# Patient Record
Sex: Female | Born: 1955 | Race: White | Hispanic: No | Marital: Single | Smoking: Never smoker
Health system: Southern US, Community
[De-identification: ages and names within clinical notes are randomized; demographics above are authoritative.]

## PROBLEM LIST (undated history)

## (undated) DIAGNOSIS — J45909 Unspecified asthma, uncomplicated: Secondary | ICD-10-CM

## (undated) DIAGNOSIS — E079 Disorder of thyroid, unspecified: Secondary | ICD-10-CM

## (undated) HISTORY — PX: FOOT SURGERY: SHX648

## (undated) HISTORY — PX: MYOMECTOMY: SHX85

## (undated) HISTORY — PX: MANDIBLE FRACTURE SURGERY: SHX706

---

## 1999-05-16 ENCOUNTER — Other Ambulatory Visit: Admission: RE | Admit: 1999-05-16 | Discharge: 1999-05-16 | Payer: Self-pay | Admitting: Gynecology

## 1999-09-20 ENCOUNTER — Ambulatory Visit (HOSPITAL_COMMUNITY): Admission: RE | Admit: 1999-09-20 | Discharge: 1999-09-20 | Payer: Self-pay | Admitting: Gastroenterology

## 1999-09-20 ENCOUNTER — Encounter (INDEPENDENT_AMBULATORY_CARE_PROVIDER_SITE_OTHER): Payer: Self-pay

## 2000-10-22 ENCOUNTER — Other Ambulatory Visit: Admission: RE | Admit: 2000-10-22 | Discharge: 2000-10-22 | Payer: Self-pay | Admitting: Gynecology

## 2002-02-10 ENCOUNTER — Other Ambulatory Visit: Admission: RE | Admit: 2002-02-10 | Discharge: 2002-02-10 | Payer: Self-pay | Admitting: Gynecology

## 2004-06-29 ENCOUNTER — Encounter: Admission: RE | Admit: 2004-06-29 | Discharge: 2004-06-29 | Payer: Self-pay | Admitting: Family Medicine

## 2005-09-13 ENCOUNTER — Other Ambulatory Visit: Admission: RE | Admit: 2005-09-13 | Discharge: 2005-09-13 | Payer: Self-pay | Admitting: Gynecology

## 2006-04-20 ENCOUNTER — Encounter: Admission: RE | Admit: 2006-04-20 | Discharge: 2006-04-20 | Payer: Self-pay | Admitting: Family Medicine

## 2008-03-25 ENCOUNTER — Other Ambulatory Visit: Admission: RE | Admit: 2008-03-25 | Discharge: 2008-03-25 | Payer: Self-pay | Admitting: Gynecology

## 2008-06-25 ENCOUNTER — Encounter: Admission: RE | Admit: 2008-06-25 | Discharge: 2008-07-27 | Payer: Self-pay | Admitting: Family Medicine

## 2008-09-28 ENCOUNTER — Encounter: Admission: RE | Admit: 2008-09-28 | Discharge: 2008-09-28 | Payer: Self-pay | Admitting: Family Medicine

## 2010-08-03 ENCOUNTER — Encounter: Admission: RE | Admit: 2010-08-03 | Discharge: 2010-08-03 | Payer: Self-pay | Admitting: Gynecology

## 2011-02-02 ENCOUNTER — Other Ambulatory Visit: Payer: Self-pay | Admitting: Gynecology

## 2011-02-02 DIAGNOSIS — Z1231 Encounter for screening mammogram for malignant neoplasm of breast: Secondary | ICD-10-CM

## 2011-02-08 ENCOUNTER — Ambulatory Visit: Payer: Self-pay

## 2011-02-16 ENCOUNTER — Ambulatory Visit
Admission: RE | Admit: 2011-02-16 | Discharge: 2011-02-16 | Disposition: A | Discharge status: Home / Self Care | Payer: 59 | Source: Ambulatory Visit | Attending: Gynecology | Admitting: Gynecology

## 2011-02-16 DIAGNOSIS — Z1231 Encounter for screening mammogram for malignant neoplasm of breast: Secondary | ICD-10-CM

## 2011-12-19 ENCOUNTER — Other Ambulatory Visit: Payer: Self-pay | Admitting: Family Medicine

## 2011-12-19 DIAGNOSIS — R42 Dizziness and giddiness: Secondary | ICD-10-CM

## 2011-12-27 ENCOUNTER — Ambulatory Visit
Admission: RE | Admit: 2011-12-27 | Discharge: 2011-12-27 | Disposition: A | Discharge status: Home / Self Care | Payer: 59 | Source: Ambulatory Visit | Attending: Family Medicine | Admitting: Family Medicine

## 2011-12-27 DIAGNOSIS — R42 Dizziness and giddiness: Secondary | ICD-10-CM

## 2011-12-27 MED ORDER — GADOBENATE DIMEGLUMINE 529 MG/ML IV SOLN
15.0000 mL | Freq: Once | INTRAVENOUS | Status: AC | PRN
  Administered 2011-12-27: 15 mL via INTRAVENOUS

## 2012-01-08 ENCOUNTER — Ambulatory Visit (INDEPENDENT_AMBULATORY_CARE_PROVIDER_SITE_OTHER): Payer: 59 | Admitting: Sports Medicine

## 2012-01-08 VITALS — BP 110/70 | Ht 64.0 in | Wt 175.0 lb

## 2012-01-08 DIAGNOSIS — M7660 Achilles tendinitis, unspecified leg: Secondary | ICD-10-CM

## 2012-01-08 DIAGNOSIS — M2170 Unequal limb length (acquired), unspecified site: Secondary | ICD-10-CM | POA: Insufficient documentation

## 2012-01-08 DIAGNOSIS — M7750 Other enthesopathy of unspecified foot: Secondary | ICD-10-CM

## 2012-01-08 DIAGNOSIS — M7662 Achilles tendinitis, left leg: Secondary | ICD-10-CM

## 2012-01-08 MED ORDER — NITROGLYCERIN 0.2 MG/HR TD PT24: MEDICATED_PATCH | TRANSDERMAL | Status: DC

## 2012-01-08 NOTE — Progress Notes (Signed)
Patient ID: Caitlin Cox, female   DOB: 1956-06-13, 56 y.o.   MRN: [id number] Pt is a 56 year old female who presents today for left foot pain.  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  Pt reports pain in the left heal and achilles tendon that is made worse by playing or standing on the balls of her feet.  This has been a long-standing issue.  She had a custom set of orthotics made in August of 2012 for use when walking and has been using OTC sports orthotics when playing.  These have helped slightly.  Patient is currently trying ice and celebrex for her pain.  These help somewhat.  Pain does not radiate and is not bad enough for her to stop playing.  Objective: General: Slightly overweight female, cooperative with exam Ext:No pain with pelvic rock.  Full ROM of both hips and knees without any pain/discomfort.   The patient's left foot transverse arch is moderately collapsed.  She has a thick callous in the region of the 2nd and 3rd metatarsal heads. There is pain on palpation of the distal Achilles tendon and posterior calcaneous on the left..  No pain on palpation of the plantar fascia.  There is mild but not significant pain on the right at insertion of PF Leg length measurements show right leg is 1.2cm shorter than left.  Ultrasound of the patient's feet show significant retro calcaneal bursitis on the left, thickened distal achilles tendon bilaterally, right worse than left;  The right measures 0.83 and looks hypoechoic on trans scan.  The left is 0.56 and the tendon looks normal. There is mild calcification and thickening of plantar fascia bilaterally with a spur on the left.  Left measures 0.53 vs 0.46 on RT.

## 2012-01-08 NOTE — Assessment & Plan Note (Signed)
We will follow this and consider CSI only if not responding to AT [facility] plan

## 2012-01-08 NOTE — Assessment & Plan Note (Addendum)
Achilles tendon rehab protoco; use 1/4 patch protocoll with nitro patches.  Bilateral heal lifts, R>L.  Reck in 4 to 6 wks  she needs new orthotics for use in tennis

## 2012-01-08 NOTE — Patient Instructions (Signed)
Nitroglycerin Protocol   Apply 1/4 nitroglycerin patch to affected area daily.  Change position of patch within the affected area every 24 hours.  You may experience a headache during the first 1-2 weeks of using the patch, these should subside.  If you experience headaches after beginning nitroglycerin patch treatment, you may take your preferred over the counter pain reliever.  Another side effect of the nitroglycerin patch is skin irritation or rash related to patch adhesive.  Please notify our office if you develop more severe headaches or rash, and stop the patch.  Tendon healing with nitroglycerin patch may require 12 to 24 weeks depending on the extent of injury.  Men should not use if taking Viagra, Cialis, or Levitra.   Do not use if you have migraines or rosacea.   Please do suggested exercises daily  Ice achilles tendon after playing tennis   Please follow up in 1 month   Thank you for seeing [place] today!

## 2012-01-08 NOTE — Assessment & Plan Note (Signed)
Some correction made with felt lifts This needs to be corrected in custom orthotics as most of her breakdown is on left side which is longer

## 2012-01-24 ENCOUNTER — Other Ambulatory Visit: Payer: Self-pay | Admitting: Family Medicine

## 2012-01-24 ENCOUNTER — Other Ambulatory Visit: Payer: Self-pay | Admitting: Gynecology

## 2012-01-24 DIAGNOSIS — Z1231 Encounter for screening mammogram for malignant neoplasm of breast: Secondary | ICD-10-CM

## 2012-02-07 ENCOUNTER — Encounter: Payer: Self-pay | Admitting: Sports Medicine

## 2012-02-07 ENCOUNTER — Ambulatory Visit (INDEPENDENT_AMBULATORY_CARE_PROVIDER_SITE_OTHER): Payer: 59 | Admitting: Sports Medicine

## 2012-02-07 VITALS — BP 137/98 | HR 73

## 2012-02-07 DIAGNOSIS — M7750 Other enthesopathy of unspecified foot: Secondary | ICD-10-CM

## 2012-02-07 DIAGNOSIS — M7662 Achilles tendinitis, left leg: Secondary | ICD-10-CM

## 2012-02-07 DIAGNOSIS — M7660 Achilles tendinitis, unspecified leg: Secondary | ICD-10-CM

## 2012-02-07 MED ORDER — MELOXICAM 15 MG PO TABS: ORAL_TABLET | ORAL | Status: DC

## 2012-02-07 MED ORDER — NITROGLYCERIN 0.2 MG/HR TD PT24: MEDICATED_PATCH | TRANSDERMAL | Status: DC

## 2012-02-07 NOTE — Assessment & Plan Note (Addendum)
Improved. Cont NTG, incr to half patch. Cont HEP. Cont Heel lifts. Return to clinic in 4 weeks.

## 2012-02-07 NOTE — Assessment & Plan Note (Signed)
Improved

## 2012-02-07 NOTE — Progress Notes (Signed)
  Subjective:    Patient ID: Caitlin Cox, female    DOB: Oct 02, 1956, 56 y.o.   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 At the last visit she was also noted to have some retrocalcaneal bursitis, as well as a leg length discrepancy. She was started on the nitroglycerin protocol, as well as eccentric exercises.  Overall she notes she is approximately 10-20% improved.  Review of Systems    No fevers, chills, night sweats, weight loss, chest pain, or shortness of breath.  Social History: Non-smoker. Objective:   Physical Exam General:  Well developed, well nourished, and in no acute distress. Neuro:  Alert and oriented x3, extra-ocular muscles intact. Skin: Warm and dry, no rashes noted. Respiratory:  Not using accessory muscles, speaking in full sentences. Musculoskeletal: Left spaceAnkle: No visible erythema or swelling. Range of motion is full in all directions. Strength is 5/5 in all directions. Stable lateral and medial ligaments; squeeze test and kleiger test unremarkable; Talar dome nontender; No pain at base of 5th MT; No tenderness over cuboid; No tenderness over N spot or navicular prominence No tenderness on posterior aspects of lateral and medial malleolus No sign of peroneal tendon subluxations or tenderness to palpation Negative tarsal tunnel tinel's No longer has any pain either over the calcaneal bursa, or over the retrocalcaneal bursa.  Tenderness still present over the mid Achilles tendon. She also has a nodule.  Ultrasound of the left Achilles shows a maximum width of 0.74 cm. She also has a significant amount of neovascularization.     Assessment & Plan:

## 2012-02-19 ENCOUNTER — Ambulatory Visit: Payer: 59

## 2012-03-13 ENCOUNTER — Ambulatory Visit
Admission: RE | Admit: 2012-03-13 | Discharge: 2012-03-13 | Disposition: A | Discharge status: Home / Self Care | Payer: 59 | Source: Ambulatory Visit | Attending: Family Medicine | Admitting: Family Medicine

## 2012-03-13 DIAGNOSIS — Z1231 Encounter for screening mammogram for malignant neoplasm of breast: Secondary | ICD-10-CM

## 2012-03-14 ENCOUNTER — Other Ambulatory Visit: Payer: Self-pay | Admitting: Family Medicine

## 2012-03-14 DIAGNOSIS — N644 Mastodynia: Secondary | ICD-10-CM

## 2012-03-20 ENCOUNTER — Ambulatory Visit (INDEPENDENT_AMBULATORY_CARE_PROVIDER_SITE_OTHER): Payer: 59 | Admitting: Sports Medicine

## 2012-03-20 VITALS — BP 142/86

## 2012-03-20 DIAGNOSIS — G5600 Carpal tunnel syndrome, unspecified upper limb: Secondary | ICD-10-CM

## 2012-03-20 DIAGNOSIS — G5601 Carpal tunnel syndrome, right upper limb: Secondary | ICD-10-CM | POA: Insufficient documentation

## 2012-03-20 NOTE — Assessment & Plan Note (Addendum)
Wrist brace qHS. Meloxicam. RTC 3-4 weeks hydrodissection/injection if no better at return visit.

## 2012-03-20 NOTE — Progress Notes (Signed)
Patient ID: Caitlin Cox, female   DOB: [birth year], 56 y.o.   MRN: [id number] Subjective:   [id number] ankle pain, as well as new wrist pain.  HPI: Right wrist pain: Present for many months, localized at the proximal carpal full, with numbness and tingling into the first or fingers. Worse at night, tends to drop objects. Has not had any conservative therapy, does have nighttime splints but does not wear them.  Left Achilles tendinosis: Currently at the 8 week mark for nitroglycerin, using one half of a patch. Overall 50-60% improved from day one, however somewhat exacerbated recently at the beach. She continues to do her home exercises, and continues to wear her heel lift most of the time.   Past medical history, surgical history, family history, social history, allergies, and medications reviewed from the medical record and no changes needed.  Review of Systems: No fevers, chills, night sweats, weight loss, chest pain, or shortness of breath.    Objective:  General:  Well Developed, well nourished, and in no acute distress. Neuro:  Alert and oriented x3, extra-ocular muscles intact. Skin: Warm and dry, no rashes noted. Respiratory:  Not using accessory muscles, speaking in full sentences. Musculoskeletal: Left Ankle: No visible erythema or swelling. Range of motion is full in all directions. Strength is 5/5 in all directions. Stable lateral and medial ligaments; squeeze test and kleiger test unremarkable; Talar dome nontender; No pain at base of 5th MT; No tenderness over cuboid; No tenderness over N spot or navicular prominence No tenderness on posterior aspects of lateral and medial malleolus No sign of peroneal tendon subluxations or tenderness to palpation Negative tarsal tunnel tinel's Tender to palpation over the Achilles, with a small palpable nodule.  Right Wrist: Inspection normal with no visible erythema or swelling. ROM smooth and normal with good flexion and  extension and ulnar/radial deviation that is symmetrical with opposite wrist. Palpation is normal over metacarpals, navicular, lunate, and TFCC; tendons without tenderness/ swelling No snuffbox tenderness. No tenderness over Canal of [place]. Strength 5/5 in all directions without pain. Negative Finkelstein Positive Tinel's and phalens. Negative Watson's test. No thenar atrophy.  Assessment & Plan:

## 2012-03-20 NOTE — Assessment & Plan Note (Signed)
>  50% improved. Cont NTG. Cont HEP. Cont Heel lift. RTC 4 weeks.

## 2012-03-26 ENCOUNTER — Ambulatory Visit: Payer: 59 | Admitting: Sports Medicine

## 2012-03-29 ENCOUNTER — Ambulatory Visit
Admission: RE | Admit: 2012-03-29 | Discharge: 2012-03-29 | Disposition: A | Discharge status: Home / Self Care | Payer: 59 | Source: Ambulatory Visit | Attending: Family Medicine | Admitting: Family Medicine

## 2012-03-29 DIAGNOSIS — N644 Mastodynia: Secondary | ICD-10-CM

## 2012-04-18 ENCOUNTER — Ambulatory Visit (INDEPENDENT_AMBULATORY_CARE_PROVIDER_SITE_OTHER): Payer: 59 | Admitting: Sports Medicine

## 2012-04-18 VITALS — BP 146/93

## 2012-04-18 DIAGNOSIS — G5600 Carpal tunnel syndrome, unspecified upper limb: Secondary | ICD-10-CM

## 2012-04-18 DIAGNOSIS — G5601 Carpal tunnel syndrome, right upper limb: Secondary | ICD-10-CM

## 2012-04-18 DIAGNOSIS — M7660 Achilles tendinitis, unspecified leg: Secondary | ICD-10-CM

## 2012-04-18 DIAGNOSIS — M7662 Achilles tendinitis, left leg: Secondary | ICD-10-CM

## 2012-04-18 MED ORDER — NITROGLYCERIN 0.2 MG/HR TD PT24: MEDICATED_PATCH | TRANSDERMAL | Status: DC

## 2012-04-18 NOTE — Assessment & Plan Note (Signed)
Improved from 0.22cm2 to 0.18cm2  Night splints help - keep these up Prn mobic  Exercises

## 2012-04-18 NOTE — Progress Notes (Signed)
  Subjective:    Patient ID: Caitlin Cox, female    DOB: 06/08/1956, 56 y.o.   MRN: [id number]  HPI Left achilles tendon, able to do strenuous hike yesterday without much pain  Has been able to do plyometrics without pain Has not been doing achilles rehab exercises on 1 leg but does do them on 2  Has been doing hand stretches and exercises, no longer keeping her awake at night Meloxicam use at night when in pain - not using that much Uses night splint and that works well Still feels some tingling with tennis but has played a few times   Review of Systems     Objective:   Physical Exam NAD Right hand: Augmented phalens negative Good grip strength, no pain Normal finger flexion strength, normal thumb strength  Left achilles: Nodule gone now, no swelling, mild tenderness to squeeze  Tendon feels normal caliber at 4 cm above heel and thicker than normal at insertion  MSK [place] Median nerve prox crease diameter 0.18cm2 and distal is 0.14cm2  AT Marked neovessel activity At insertion partial tear with hypoechoic change Less retrocalcaneal bursal swelling but still significant Width is 1.04cm       Assessment & Plan:  Need to do achilles rehab exercises 3x/week

## 2012-04-18 NOTE — Assessment & Plan Note (Signed)
This is healing clinically On scan still very abnormal with partial tear  We will probably need to keep up NTG minimum of 6 mos - maybe 1 year Keep up exercises   Reck 2 to 3 mos

## 2012-06-07 ENCOUNTER — Ambulatory Visit (INDEPENDENT_AMBULATORY_CARE_PROVIDER_SITE_OTHER): Payer: 59 | Admitting: Family Medicine

## 2012-06-07 ENCOUNTER — Ambulatory Visit (HOSPITAL_BASED_OUTPATIENT_CLINIC_OR_DEPARTMENT_OTHER)
Admission: RE | Admit: 2012-06-07 | Discharge: 2012-06-07 | Disposition: A | Discharge status: Home / Self Care | Payer: 59 | Source: Ambulatory Visit | Attending: Family Medicine | Admitting: Family Medicine

## 2012-06-07 ENCOUNTER — Encounter: Payer: Self-pay | Admitting: Family Medicine

## 2012-06-07 VITALS — BP 153/93 | HR 76 | Ht 64.0 in | Wt 172.0 lb

## 2012-06-07 DIAGNOSIS — X58XXXA Exposure to other specified factors, initial encounter: Secondary | ICD-10-CM | POA: Insufficient documentation

## 2012-06-07 DIAGNOSIS — S6980XA Other specified injuries of unspecified wrist, hand and finger(s), initial encounter: Secondary | ICD-10-CM | POA: Insufficient documentation

## 2012-06-07 DIAGNOSIS — S6990XA Unspecified injury of unspecified wrist, hand and finger(s), initial encounter: Secondary | ICD-10-CM

## 2012-06-07 NOTE — Assessment & Plan Note (Signed)
Right 4th digit injury - radiographs negative for fracture.  Tendons intact with full strength on testing at PIP and DIP joints.  Collateral ligaments intact though pain on stressing UCL 4th PIP suggesting sprain of this ligament and PIP joint.  Reassured patient.  Shown how to buddy tape finger.  Should improve over the next 2-3 weeks. Tylenol, nsaids as needed. Icing as needed.  Call if any concerns or if worsening instead of improving over next 2-3 weeks.

## 2012-06-07 NOTE — Progress Notes (Signed)
Subjective:    Patient ID: Caitlin Cox, female    DOB: 10-27-1955, 56 y.o.   MRN: [id number]  PCP: Gweneth Dimitri MD  HPI 56 yo F here for right 4th finger injury.  Patient reports on 8/27 while holding leash of her dog, the dog saw a squirrel and started to run. 4th and 5th fingers caught and tangled up in leash. Jerked both of these fingers causing pain, swelling in both throughout. 5th finger has improved completely but still having pain in 4th digit around PIP. Did not need to put this finger back into place. Has been icing and splinting. Unable to sleep due to pain at times. Has not had this worked up yet. Right handed.  History reviewed. No pertinent past medical history.  Current Outpatient Prescriptions on File Prior to Visit  Medication Sig Dispense Refill  . buPROPion (WELLBUTRIN) 75 MG tablet Take 75 mg by mouth daily.      . COMBIPATCH 0.05-0.14 MG/DAY Place 1 patch onto the skin 2 (two) times a week.      . Fluticasone-Salmeterol (ADVAIR DISKUS) 250-50 MCG/DOSE AEPB Inhale 1 puff into the lungs every 12 (twelve) hours.      . nitroGLYCERIN (NITRODUR - DOSED IN MG/24 HR) 0.2 mg/hr Apply 1/2 patch to affected area daily.  Change patch every 24 hours.  30 patch  1  . sertraline (ZOLOFT) 50 MG tablet Take 50 mg by mouth daily.      Marland Kitchen SYNTHROID 150 MCG tablet Take 150 mcg by mouth daily.      . clonazePAM (KLONOPIN) 0.5 MG tablet Take 0.5 mg by mouth as needed.        Past Surgical History  Procedure Date  . Mandible fracture surgery   . Foot surgery     Allergies  Allergen Reactions  . Chlorzoxazone   . Doxycycline   . Oxycodone-Acetaminophen   . Penicillins     History   Social History  . Marital Status: Single    Spouse Name: N/A    Number of Children: N/A  . Years of Education: N/A   Occupational History  . Not on file.   Social History Main Topics  . Smoking status: Never Smoker   . Smokeless tobacco: Never Used  . Alcohol Use: Not on file   . Drug Use: Not on file  . Sexually Active: Not on file   Other Topics Concern  . Not on file   Social History Narrative  . No narrative on file    Family History  Problem Relation Age of Onset  . Heart attack Mother   . Diabetes Mother   . Heart attack Father   . Hyperlipidemia Father   . Hypertension Neg Hx   . Sudden death Neg Hx     BP 153/93  Pulse 76  Ht 5\' 4"  (1.626 m)  Wt 172 lb (78.019 kg)  BMI 29.52 kg/m2  Review of Systems See HPI above.    Objective:   Physical Exam Gen: NAD  R hand: Mild swelling circumferentially about PIP of 4th digit.  No bruising, other deformity. TTP greatest over UCL PIP joint 4th digit though less pain circumferentially about this joint.   FROM PIP, DIP, MCP of 4th digit. 5/5 strength with resisted flexion and extension of PIP and DIP joints 4th digit. Collateral ligaments intact but pain with UCL stress of 4th PIP joint. NVI distally.     Assessment & Plan:  1. Right 4th digit injury -  radiographs negative for fracture.  Tendons intact with full strength on testing at PIP and DIP joints.  Collateral ligaments intact though pain on stressing UCL 4th PIP suggesting sprain of this ligament and PIP joint.  Reassured patient.  Shown how to buddy tape finger.  Should improve over the next 2-3 weeks. Tylenol, nsaids as needed. Icing as needed.  Call if any concerns or if worsening instead of improving over next 2-3 weeks.

## 2012-07-16 ENCOUNTER — Encounter: Payer: Self-pay | Admitting: Sports Medicine

## 2012-07-16 ENCOUNTER — Ambulatory Visit (INDEPENDENT_AMBULATORY_CARE_PROVIDER_SITE_OTHER): Payer: 59 | Admitting: Sports Medicine

## 2012-07-16 VITALS — BP 162/97 | HR 69 | Ht 64.0 in | Wt 173.0 lb

## 2012-07-16 DIAGNOSIS — M722 Plantar fascial fibromatosis: Secondary | ICD-10-CM

## 2012-07-16 DIAGNOSIS — M6788 Other specified disorders of synovium and tendon, other site: Secondary | ICD-10-CM

## 2012-07-16 DIAGNOSIS — M7660 Achilles tendinitis, unspecified leg: Secondary | ICD-10-CM

## 2012-07-16 NOTE — Progress Notes (Signed)
  Subjective:    Patient ID: Caitlin Cox, female    DOB: Jun 27, 1956, 56 y.o.   MRN: [id number]  HPI Caitlin Cox is coming in today for recurrent left heel pain.  She is currently being treated by Dr. Darrick Penna for left achilles tendonopathy with exercises and nitroglycerin patch.  She reports doing well until about 10 days ago.  She was helping her brother build a deck which involved lots of squatting which flared up her pain.  She states this improved with 3 days of rest, icing, and home ultrasound machine.  Then, 4 days ago, again while helping with the deck, the pain flared up.  This pain is located more around the heel and at the posterior aspect of the arch which is different from her achilles pain.  She has been using a heel cup instead of her orthotics as well as ice and ultrasound again, but it continues to be sore and limit her activities.   Review of Systems     Objective:   Physical Exam Gen: alert, cooperative, NAD Left foot: no erythema or edema, full AROM, tender to palpation over plantar fascia insertion and mildly over achilles tendon, 5/5 strength in dorsiflexion and plantar flexion without pain  Left foot ultrasound: Plantar fascia is normal thickness with area of underlying hypoechogenicity.  Achilles tendon still with several areas of hypoechogenicity within the tendon, but appears improved from previous.      Assessment & Plan:  Left heel pain: Likely combination of achilles tendonopathy and early plantar fasciitis.  Continue to use heel cup for the next week or until foot pain improves.  Ice heel daily for the next week.  Will give arch strap to wear to provide support.  Follow up in 3 weeks or sooner if needed.  HTN: BP quite elevated today, but she reports typically much better.  Did go to the chiropractor for adjustment prior to this appointment.

## 2012-07-30 ENCOUNTER — Ambulatory Visit: Payer: 59 | Admitting: Sports Medicine

## 2013-01-02 ENCOUNTER — Other Ambulatory Visit: Payer: Self-pay | Admitting: Gynecology

## 2013-01-02 DIAGNOSIS — N63 Unspecified lump in unspecified breast: Secondary | ICD-10-CM

## 2013-01-14 ENCOUNTER — Other Ambulatory Visit: Payer: 59

## 2013-01-20 ENCOUNTER — Ambulatory Visit
Admission: RE | Admit: 2013-01-20 | Discharge: 2013-01-20 | Disposition: A | Discharge status: Home / Self Care | Payer: 59 | Source: Ambulatory Visit | Attending: Gynecology | Admitting: Gynecology

## 2013-01-20 ENCOUNTER — Other Ambulatory Visit: Payer: Self-pay | Admitting: Gynecology

## 2013-01-20 DIAGNOSIS — N63 Unspecified lump in unspecified breast: Secondary | ICD-10-CM

## 2013-02-07 ENCOUNTER — Ambulatory Visit (INDEPENDENT_AMBULATORY_CARE_PROVIDER_SITE_OTHER): Payer: 59 | Admitting: Family Medicine

## 2013-02-07 ENCOUNTER — Encounter: Payer: Self-pay | Admitting: Family Medicine

## 2013-02-07 ENCOUNTER — Ambulatory Visit (HOSPITAL_BASED_OUTPATIENT_CLINIC_OR_DEPARTMENT_OTHER)
Admission: RE | Admit: 2013-02-07 | Discharge: 2013-02-07 | Disposition: A | Discharge status: Home / Self Care | Payer: 59 | Source: Ambulatory Visit | Attending: Family Medicine | Admitting: Family Medicine

## 2013-02-07 VITALS — BP 149/78 | HR 78 | Ht 64.0 in | Wt 170.0 lb

## 2013-02-07 DIAGNOSIS — M79609 Pain in unspecified limb: Secondary | ICD-10-CM

## 2013-02-07 DIAGNOSIS — M7660 Achilles tendinitis, unspecified leg: Secondary | ICD-10-CM

## 2013-02-07 DIAGNOSIS — M7662 Achilles tendinitis, left leg: Secondary | ICD-10-CM

## 2013-02-07 DIAGNOSIS — M79671 Pain in right foot: Secondary | ICD-10-CM

## 2013-02-07 MED ORDER — NITROGLYCERIN 0.2 MG/HR TD PT24: MEDICATED_PATCH | TRANSDERMAL | Status: DC

## 2013-02-07 NOTE — Patient Instructions (Addendum)
Your x-rays were negative for a fracture. You have sprained and bruised your foot/5th metatarsal. Consider hard soled shoe for comfort. Icing 15 minutes at a time 3-4 times a day. Tylenol and/or aleve as needed for pain. Activities as tolerated. Follow up with me as needed.

## 2013-02-10 ENCOUNTER — Encounter: Payer: Self-pay | Admitting: Family Medicine

## 2013-02-10 DIAGNOSIS — M79671 Pain in right foot: Secondary | ICD-10-CM | POA: Insufficient documentation

## 2013-02-10 NOTE — Assessment & Plan Note (Signed)
Right foot sprain - radiographs negative.  Icing, tylenol/aleve as needed.  Activities as tolerated.  Declined hard soled shoe for now.  F/u prn.  Should improve over next 2-3 weeks.

## 2013-02-10 NOTE — Progress Notes (Signed)
  Subjective:    Patient ID: Caitlin Cox, female    DOB: [birth year], 57 y.o.   MRN: [id number]  PCP: Gweneth Dimitri MD  HPI 57 yo F here for right foot injury.  Patient reports she was going downstairs on 4/30 when she missed the last step and inverted her right ankle then fell down. Thought she broke her pinky toe as most of pain, swelling was over here. Has been icing. Not taking any meds or using a hard sole shoe. No ankle pain. No other complaints.  History reviewed. No pertinent past medical history.  Current Outpatient Prescriptions on File Prior to Visit  Medication Sig Dispense Refill  . buPROPion (WELLBUTRIN) 75 MG tablet Take 75 mg by mouth daily.      . clonazePAM (KLONOPIN) 0.5 MG tablet Take 0.5 mg by mouth as needed.      Marland Kitchen EVAMIST 1.53 MG/SPRAY transdermal spray Apply topically daily.      . progesterone (PROMETRIUM) 200 MG capsule Take 200 mg by mouth daily.      . sertraline (ZOLOFT) 50 MG tablet Take 50 mg by mouth daily.      . SYMBICORT 160-4.5 MCG/ACT inhaler Inhale 2 puffs into the lungs daily.      Marland Kitchen SYNTHROID 150 MCG tablet Take 150 mcg by mouth daily.      . valACYclovir (VALTREX) 1000 MG tablet Take 1 g by mouth Once daily as needed.       No current facility-administered medications on file prior to visit.    Past Surgical History  Procedure Laterality Date  . Mandible fracture surgery    . Foot surgery      Allergies  Allergen Reactions  . Chlorzoxazone   . Doxycycline   . Oxycodone-Acetaminophen   . Penicillins     History   Social History  . Marital Status: Single    Spouse Name: N/A    Number of Children: N/A  . Years of Education: N/A   Occupational History  . Not on file.   Social History Main Topics  . Smoking status: Never Smoker   . Smokeless tobacco: Never Used  . Alcohol Use: Not on file  . Drug Use: Not on file  . Sexually Active: Not on file   Other Topics Concern  . Not on file   Social History Narrative   . No narrative on file    Family History  Problem Relation Age of Onset  . Heart attack Mother   . Diabetes Mother   . Heart attack Father   . Hyperlipidemia Father   . Hypertension Neg Hx   . Sudden death Neg Hx     BP 149/78  Pulse 78  Ht 5\' 4"  (1.626 m)  Wt 170 lb (77.111 kg)  BMI 29.17 kg/m2  Review of Systems See HPI above.    Objective:   Physical Exam Gen: NAD  R foot/ankle: Mild swelling, bruising 5th digit.  No other deformity. FROM of ankle with 5/5 strength all directions, no pain. TTP 5th digit, distal 5th metatarsal.  No other TTP foot/ankle. Negative ant drawer and talar tilt.   Negative syndesmotic compression. Mild pain metatarsal squeeze. Thompsons test negative. NV intact distally.    Assessment & Plan:  1. Right foot sprain - radiographs negative.  Icing, tylenol/aleve as needed.  Activities as tolerated.  Declined hard soled shoe for now.  F/u prn.  Should improve over next 2-3 weeks.

## 2013-06-24 ENCOUNTER — Encounter: Payer: Self-pay | Admitting: Family Medicine

## 2013-06-24 ENCOUNTER — Ambulatory Visit (INDEPENDENT_AMBULATORY_CARE_PROVIDER_SITE_OTHER): Payer: 59 | Admitting: Family Medicine

## 2013-06-24 VITALS — BP 146/92 | HR 82 | Ht 64.0 in | Wt 170.0 lb

## 2013-06-24 DIAGNOSIS — M25569 Pain in unspecified knee: Secondary | ICD-10-CM

## 2013-06-24 DIAGNOSIS — M25561 Pain in right knee: Secondary | ICD-10-CM

## 2013-06-24 MED ORDER — MELOXICAM 15 MG PO TABS: 15.0000 mg | ORAL_TABLET | Freq: Every day | ORAL | Status: DC

## 2013-06-25 ENCOUNTER — Encounter: Payer: Self-pay | Admitting: Family Medicine

## 2013-06-25 DIAGNOSIS — M25561 Pain in right knee: Secondary | ICD-10-CM | POA: Insufficient documentation

## 2013-06-25 NOTE — Progress Notes (Signed)
Patient ID: Caitlin Cox, female   DOB: [birth year], 57 y.o.   MRN: [id number]  PCP: Gweneth Dimitri, MD  Subjective:   HPI: Patient is a 57 y.o. female here for right knee pain.  Patient denies known injury. States pain started over past 2 weeks - started following mountain biking for 2.5 hours . Soreness felt in lateral knee. Taking meloxicam which helps though running out of this. Icing as well. Has felt warm to the touch. Played tennis on Monday and pain worsened. No catching, locking, giving out.  History reviewed. No pertinent past medical history.  Current Outpatient Prescriptions on File Prior to Visit  Medication Sig Dispense Refill  . buPROPion (WELLBUTRIN) 75 MG tablet Take 75 mg by mouth daily.      . clonazePAM (KLONOPIN) 0.5 MG tablet Take 0.5 mg by mouth as needed.      Marland Kitchen EVAMIST 1.53 MG/SPRAY transdermal spray Apply topically daily.      . nitroGLYCERIN (NITRODUR - DOSED IN MG/24 HR) 0.2 mg/hr Apply 1/2 patch to affected area daily.  Change patch every 24 hours.  30 patch  1  . progesterone (PROMETRIUM) 200 MG capsule Take 200 mg by mouth daily.      . sertraline (ZOLOFT) 50 MG tablet Take 50 mg by mouth daily.      . SYMBICORT 160-4.5 MCG/ACT inhaler Inhale 2 puffs into the lungs daily.      Marland Kitchen SYNTHROID 150 MCG tablet Take 150 mcg by mouth daily.      . valACYclovir (VALTREX) 1000 MG tablet Take 1 g by mouth Once daily as needed.       No current facility-administered medications on file prior to visit.    Past Surgical History  Procedure Laterality Date  . Mandible fracture surgery    . Foot surgery      Allergies  Allergen Reactions  . Chlorzoxazone   . Doxycycline   . Oxycodone-Acetaminophen   . Penicillins     History   Social History  . Marital Status: Single    Spouse Name: N/A    Number of Children: N/A  . Years of Education: N/A   Occupational History  . Not on file.   Social History Main Topics  . Smoking status: Never Smoker   .  Smokeless tobacco: Never Used  . Alcohol Use: Not on file  . Drug Use: Not on file  . Sexual Activity: Not on file   Other Topics Concern  . Not on file   Social History Narrative  . No narrative on file    Family History  Problem Relation Age of Onset  . Heart attack Mother   . Diabetes Mother   . Heart attack Father   . Hyperlipidemia Father   . Hypertension Neg Hx   . Sudden death Neg Hx     BP 146/92  Pulse 82  Ht 5\' 4"  (1.626 m)  Wt 170 lb (77.111 kg)  BMI 29.17 kg/m2  Review of Systems: See HPI above.    Objective:  Physical Exam:  Gen: NAD  Right knee: No gross deformity, ecchymoses, swelling. No TTP. FROM. Negative ant/post drawers. Negative valgus/varus testing. Negative lachmanns. Negative mcmurrays, apleys, patellar apprehension. NV intact distally.    Assessment & Plan:  1. Right knee pain - exam benign and no acute injury.  Suspect this is due to a flare of underlying arthritis.  Reassured patient.  Discussed tylenol and refilled her meloxicam.  Consider capsaicin, glucosamine, injection, radiographs if this  does not continue to improve.  Otherwise f/u prn.

## 2013-06-25 NOTE — Assessment & Plan Note (Signed)
exam benign and no acute injury.  Suspect this is due to a flare of underlying arthritis.  Reassured patient.  Discussed tylenol and refilled her meloxicam.  Consider capsaicin, glucosamine, injection, radiographs if this does not continue to improve.  Otherwise f/u prn.

## 2013-08-31 ENCOUNTER — Emergency Department (HOSPITAL_COMMUNITY): Payer: 59

## 2013-08-31 ENCOUNTER — Encounter (HOSPITAL_COMMUNITY): Payer: Self-pay | Admitting: Emergency Medicine

## 2013-08-31 ENCOUNTER — Inpatient Hospital Stay (HOSPITAL_COMMUNITY)
Admission: EM | Admit: 2013-08-31 | Discharge: 2013-09-05 | DRG: 964 | Disposition: A | Discharge status: Home / Self Care | Payer: 59 | Attending: General Surgery | Admitting: General Surgery

## 2013-08-31 DIAGNOSIS — S2232XA Fracture of one rib, left side, initial encounter for closed fracture: Secondary | ICD-10-CM

## 2013-08-31 DIAGNOSIS — S32009A Unspecified fracture of unspecified lumbar vertebra, initial encounter for closed fracture: Secondary | ICD-10-CM

## 2013-08-31 DIAGNOSIS — Y998 Other external cause status: Secondary | ICD-10-CM

## 2013-08-31 DIAGNOSIS — M25529 Pain in unspecified elbow: Secondary | ICD-10-CM | POA: Diagnosis present

## 2013-08-31 DIAGNOSIS — S2249XA Multiple fractures of ribs, unspecified side, initial encounter for closed fracture: Principal | ICD-10-CM | POA: Diagnosis present

## 2013-08-31 DIAGNOSIS — W132XXA Fall from, out of or through roof, initial encounter: Secondary | ICD-10-CM

## 2013-08-31 DIAGNOSIS — H539 Unspecified visual disturbance: Secondary | ICD-10-CM | POA: Diagnosis present

## 2013-08-31 DIAGNOSIS — S270XXA Traumatic pneumothorax, initial encounter: Secondary | ICD-10-CM | POA: Diagnosis present

## 2013-08-31 DIAGNOSIS — J939 Pneumothorax, unspecified: Secondary | ICD-10-CM | POA: Diagnosis present

## 2013-08-31 DIAGNOSIS — IMO0002 Reserved for concepts with insufficient information to code with codable children: Secondary | ICD-10-CM

## 2013-08-31 DIAGNOSIS — S2242XA Multiple fractures of ribs, left side, initial encounter for closed fracture: Secondary | ICD-10-CM

## 2013-08-31 DIAGNOSIS — Y92009 Unspecified place in unspecified non-institutional (private) residence as the place of occurrence of the external cause: Secondary | ICD-10-CM

## 2013-08-31 DIAGNOSIS — S37819A Unspecified injury of adrenal gland, initial encounter: Secondary | ICD-10-CM | POA: Diagnosis present

## 2013-08-31 DIAGNOSIS — K5900 Constipation, unspecified: Secondary | ICD-10-CM | POA: Diagnosis not present

## 2013-08-31 DIAGNOSIS — R42 Dizziness and giddiness: Secondary | ICD-10-CM | POA: Diagnosis not present

## 2013-08-31 DIAGNOSIS — Y93H9 Activity, other involving exterior property and land maintenance, building and construction: Secondary | ICD-10-CM

## 2013-08-31 DIAGNOSIS — J45909 Unspecified asthma, uncomplicated: Secondary | ICD-10-CM | POA: Diagnosis present

## 2013-08-31 DIAGNOSIS — E039 Hypothyroidism, unspecified: Secondary | ICD-10-CM | POA: Diagnosis present

## 2013-08-31 DIAGNOSIS — E2749 Other adrenocortical insufficiency: Secondary | ICD-10-CM | POA: Diagnosis present

## 2013-08-31 DIAGNOSIS — W11XXXA Fall on and from ladder, initial encounter: Secondary | ICD-10-CM | POA: Diagnosis present

## 2013-08-31 HISTORY — DX: Disorder of thyroid, unspecified: E07.9

## 2013-08-31 HISTORY — DX: Unspecified asthma, uncomplicated: J45.909

## 2013-08-31 LAB — CBC WITH DIFFERENTIAL/PLATELET
Basophils Absolute: 0.1 10*3/uL (ref 0.0–0.1)
Basophils Relative: 0 % (ref 0–1)
HCT: 43.2 % (ref 36.0–46.0)
Hemoglobin: 15.3 g/dL — ABNORMAL HIGH (ref 12.0–15.0)
Lymphocytes Relative: 16 % (ref 12–46)
Monocytes Absolute: 0.8 10*3/uL (ref 0.1–1.0)
Neutro Abs: 13.8 10*3/uL — ABNORMAL HIGH (ref 1.7–7.7)
Neutrophils Relative %: 78 % — ABNORMAL HIGH (ref 43–77)
RDW: 12.7 % (ref 11.5–15.5)
WBC: 17.7 10*3/uL — ABNORMAL HIGH (ref 4.0–10.5)

## 2013-08-31 LAB — POCT I-STAT, CHEM 8
Chloride: 101 mEq/L (ref 96–112)
Glucose, Bld: 201 mg/dL — ABNORMAL HIGH (ref 70–99)
HCT: 48 % — ABNORMAL HIGH (ref 36.0–46.0)
Hemoglobin: 16.3 g/dL — ABNORMAL HIGH (ref 12.0–15.0)
Potassium: 4.7 mEq/L (ref 3.5–5.1)
Sodium: 136 mEq/L (ref 135–145)

## 2013-08-31 MED ORDER — BUPROPION HCL 75 MG PO TABS
150.0000 mg | ORAL_TABLET | Freq: Every day | ORAL | Status: DC
  Administered 2013-09-01 – 2013-09-05 (×5): 150 mg via ORAL
  Filled 2013-08-31 (×7): qty 2

## 2013-08-31 MED ORDER — ONDANSETRON HCL 4 MG/2ML IJ SOLN
4.0000 mg | Freq: Once | INTRAMUSCULAR | Status: AC
  Administered 2013-08-31: 4 mg via INTRAVENOUS
  Filled 2013-08-31: qty 2

## 2013-08-31 MED ORDER — ONDANSETRON HCL 4 MG/2ML IJ SOLN
4.0000 mg | Freq: Once | INTRAMUSCULAR | Status: DC
  Filled 2013-08-31: qty 2

## 2013-08-31 MED ORDER — DOCUSATE SODIUM 100 MG PO CAPS
100.0000 mg | ORAL_CAPSULE | Freq: Two times a day (BID) | ORAL | Status: DC
  Administered 2013-08-31 – 2013-09-05 (×10): 100 mg via ORAL
  Filled 2013-08-31 (×9): qty 1

## 2013-08-31 MED ORDER — HYDROMORPHONE HCL PF 1 MG/ML IJ SOLN
1.0000 mg | Freq: Once | INTRAMUSCULAR | Status: AC
  Administered 2013-08-31: 1 mg via INTRAVENOUS
  Filled 2013-08-31: qty 1

## 2013-08-31 MED ORDER — BACITRACIN-NEOMYCIN-POLYMYXIN OINTMENT TUBE
1.0000 "application " | TOPICAL_OINTMENT | Freq: Two times a day (BID) | CUTANEOUS | Status: DC
  Administered 2013-08-31 – 2013-09-05 (×10): 1 via TOPICAL
  Filled 2013-08-31 (×3): qty 15

## 2013-08-31 MED ORDER — ONDANSETRON HCL 4 MG/2ML IJ SOLN
4.0000 mg | Freq: Four times a day (QID) | INTRAMUSCULAR | Status: DC
  Administered 2013-08-31 – 2013-09-02 (×7): 4 mg via INTRAVENOUS
  Filled 2013-08-31 (×7): qty 2

## 2013-08-31 MED ORDER — LEVOTHYROXINE SODIUM 150 MCG PO TABS
150.0000 ug | ORAL_TABLET | Freq: Every day | ORAL | Status: DC
  Administered 2013-09-01 – 2013-09-05 (×5): 150 ug via ORAL
  Filled 2013-08-31 (×7): qty 1

## 2013-08-31 MED ORDER — MOMETASONE FURO-FORMOTEROL FUM 100-5 MCG/ACT IN AERO
2.0000 | INHALATION_SPRAY | Freq: Two times a day (BID) | RESPIRATORY_TRACT | Status: DC
  Administered 2013-09-01 – 2013-09-05 (×8): 2 via RESPIRATORY_TRACT
  Filled 2013-08-31 (×3): qty 8.8

## 2013-08-31 MED ORDER — PROMETHAZINE HCL 25 MG/ML IJ SOLN
12.5000 mg | Freq: Four times a day (QID) | INTRAMUSCULAR | Status: DC | PRN
  Administered 2013-08-31 – 2013-09-01 (×3): 12.5 mg via INTRAVENOUS
  Filled 2013-08-31 (×3): qty 1

## 2013-08-31 MED ORDER — MORPHINE SULFATE 4 MG/ML IJ SOLN
4.0000 mg | Freq: Once | INTRAMUSCULAR | Status: AC
  Administered 2013-08-31: 4 mg via INTRAVENOUS
  Filled 2013-08-31: qty 1

## 2013-08-31 MED ORDER — SODIUM CHLORIDE 0.9 % IV BOLUS (SEPSIS)
1000.0000 mL | Freq: Once | INTRAVENOUS | Status: AC
  Administered 2013-08-31: 1000 mL via INTRAVENOUS

## 2013-08-31 MED ORDER — CLONAZEPAM 0.5 MG PO TABS
0.5000 mg | ORAL_TABLET | Freq: Every day | ORAL | Status: DC | PRN
  Administered 2013-09-02 – 2013-09-04 (×3): 0.5 mg via ORAL
  Filled 2013-08-31 (×3): qty 1

## 2013-08-31 MED ORDER — ONDANSETRON HCL 4 MG/2ML IJ SOLN
INTRAMUSCULAR | Status: AC
  Administered 2013-08-31: 4 mg via INTRAVENOUS
  Filled 2013-08-31: qty 2

## 2013-08-31 MED ORDER — IOHEXOL 300 MG/ML  SOLN
100.0000 mL | Freq: Once | INTRAMUSCULAR | Status: AC | PRN
  Administered 2013-08-31: 100 mL via INTRAVENOUS

## 2013-08-31 MED ORDER — SODIUM CHLORIDE 0.9 % IV SOLN
INTRAVENOUS | Status: DC
  Administered 2013-08-31: 17:00:00 via INTRAVENOUS
  Administered 2013-09-01: 1000 mL via INTRAVENOUS
  Administered 2013-09-01: 19:00:00 via INTRAVENOUS

## 2013-08-31 MED ORDER — ENOXAPARIN SODIUM 40 MG/0.4ML ~~LOC~~ SOLN
40.0000 mg | SUBCUTANEOUS | Status: DC
  Administered 2013-09-01 – 2013-09-05 (×5): 40 mg via SUBCUTANEOUS
  Filled 2013-08-31 (×5): qty 0.4

## 2013-08-31 MED ORDER — HYDROMORPHONE HCL PF 1 MG/ML IJ SOLN
1.0000 mg | INTRAMUSCULAR | Status: DC | PRN
  Administered 2013-08-31 – 2013-09-02 (×9): 1 mg via INTRAVENOUS
  Filled 2013-08-31 (×9): qty 1

## 2013-08-31 NOTE — ED Provider Notes (Addendum)
CSN: [id number]     Arrival date & time 08/31/13  1036 History   First MD Initiated Contact with Patient 08/31/13 1038     Chief Complaint  Patient presents with  . Fall  . Rib Injury   (Consider location/radiation/quality/duration/timing/severity/associated sxs/prior Treatment) HPI  This is a 57 year old female with a past medical history significant for asthma who presents following a fall. Patient was coming off a roof onto a ladder when she fell 7-8 feet onto concrete. She fell onto her left side and back. She denies hitting her head or losing consciousness. She was able to ambulate afterwards and called EMS. She's currently complaining of left back and chest pain. She was given 50 mcg of fentanyl route. She does not take any anticoagulants or antiplatelet products. She denies any shortness of breath.  Patient denies any extremity weakness, numbness, or tingling.  Past Medical History  Diagnosis Date  . Asthma   . Thyroid disease    Past Surgical History  Procedure Laterality Date  . Mandible fracture surgery    . Foot surgery    . Myomectomy     Family History  Problem Relation Age of Onset  . Heart attack Mother   . Diabetes Mother   . Heart attack Father   . Hyperlipidemia Father   . Hypertension Neg Hx   . Sudden death Neg Hx    History  Substance Use Topics  . Smoking status: Never Smoker   . Smokeless tobacco: Never Used  . Alcohol Use: Yes     Comment: 2-3 drinks per day   OB History   Grav Para Term Preterm Abortions TAB SAB Ect Mult Living                 Review of Systems  Respiratory: Negative for cough, chest tightness and shortness of breath.   Cardiovascular: Positive for chest pain.  Gastrointestinal: Negative for nausea, vomiting and abdominal pain.  Genitourinary: Negative for dysuria.  Musculoskeletal: Positive for back pain. Negative for neck pain.  Skin: Negative for wound.  Neurological: Negative for headaches.  Psychiatric/Behavioral:  Negative for confusion.  All other systems reviewed and are negative.    Allergies  Chlorzoxazone; Doxycycline; Oxycodone-acetaminophen; and Penicillins  Home Medications   No current outpatient prescriptions on file. BP 161/79  Pulse 81  Temp(Src) 99 F (37.2 C) (Oral)  Resp 15  Ht 5\' 4"  (1.626 m)  Wt 171 lb 15.3 oz (78 kg)  BMI 29.50 kg/m2  SpO2 98% Physical Exam  Nursing note and vitals reviewed. Constitutional: She is oriented to person, place, and time.  Boarded and collared, diaphoretic  HENT:  Head: Normocephalic and atraumatic.  Right Ear: External ear normal.  Left Ear: External ear normal.  Mouth/Throat: Oropharynx is clear and moist.  Eyes: EOM are normal. Pupils are equal, round, and reactive to light.  Neck: Neck supple.  No midline C-spine tenderness  Cardiovascular: Normal rate, regular rhythm and normal heart sounds.   No murmur heard. Pulmonary/Chest: Effort normal and breath sounds normal. No respiratory distress. She has no wheezes. She exhibits tenderness.  Tenderness to palpation with lateral compression of the chest over the left chest wall, no crepitus noted  Abdominal: Soft. Bowel sounds are normal. There is no tenderness.  Musculoskeletal:  Full range of motion of bilateral hips and knees, no midline back pain, step-off, or deformity.  Left elbow abrasion with full ROM.  Neurological: She is alert and oriented to person, place, and time.  5  out of 5 strength in all 4 extremities, sensation intact to light touch  Skin: Skin is warm and dry.  Psychiatric: She has a normal mood and affect.    ED Course  Procedures (including critical care time) Labs Review Labs Reviewed  CBC WITH DIFFERENTIAL - Abnormal; Notable for the following:    WBC 17.7 (*)    Hemoglobin 15.3 (*)    MCH 34.9 (*)    Neutrophils Relative % 78 (*)    Neutro Abs 13.8 (*)    All other components within normal limits  CBC - Abnormal; Notable for the following:    RBC 3.59  (*)    HCT 35.9 (*)    All other components within normal limits  BASIC METABOLIC PANEL - Abnormal; Notable for the following:    Sodium 134 (*)    Glucose, Bld 106 (*)    GFR calc non Af Amer 63 (*)    GFR calc Af Amer 73 (*)    All other components within normal limits  POCT I-STAT, CHEM 8 - Abnormal; Notable for the following:    Glucose, Bld 201 (*)    Hemoglobin 16.3 (*)    HCT 48.0 (*)    All other components within normal limits  MRSA PCR SCREENING  CG4 I-STAT (LACTIC ACID)   Imaging Review Ct Head Wo Contrast  08/31/2013   CLINICAL DATA:  Fall from roof.  Dizziness with nausea and vomiting.  EXAM: CT HEAD WITHOUT CONTRAST  CT CERVICAL SPINE WITHOUT CONTRAST  TECHNIQUE: Multidetector CT imaging of the head and cervical spine was performed following the standard protocol without intravenous contrast. Multiplanar CT image reconstructions of the cervical spine were also generated.  COMPARISON:  MRI brain 12/27/2011. Cervical spine radiographs 04/20/2006.  FINDINGS: CT HEAD FINDINGS  There is no evidence of acute intracranial hemorrhage, mass lesion, brain edema or extra-axial fluid collection. The ventricles and subarachnoid spaces are appropriately sized for age. There is no CT evidence of acute cortical infarction.  The visualized paranasal sinuses, mastoid air cells and middle ears are clear. Postsurgical changes are noted within the face. The calvarium is intact.  CT CERVICAL SPINE FINDINGS  The alignment is stable with mild straightening. There is no evidence of acute fracture or traumatic subluxation. There is generally stable multilevel spondylosis with disc space loss and uncinate spurring most advanced at C5-6 and C6-7. Mild foraminal narrowing is present at those levels. There is facet disease throughout the cervical spine. No acute soft tissue findings are demonstrated. The lung apices are clear.  IMPRESSION: 1. No acute intracranial or calvarial findings. 2. No evidence of acute  cervical spine fracture, traumatic subluxation or static signs of instability. 3. Stable cervical spondylosis.   Electronically Signed   By: Roxy Horseman M.D.   On: 08/31/2013 15:20   Ct Chest W Contrast  08/31/2013   CLINICAL DATA:  Fall from roof.  Left sided pain.  EXAM: CT CHEST, ABDOMEN, AND PELVIS WITH CONTRAST  TECHNIQUE: Multidetector CT imaging of the chest, abdomen and pelvis was performed following the standard protocol during bolus administration of intravenous contrast.  CONTRAST:  OMNIPAQUE IOHEXOL 300 MG/ML  SOLN  COMPARISON:  None.  FINDINGS: CT CHEST FINDINGS  There are left rib fractures involving the 8th through 11th ribs posterolateral E. the 9th rib is displaced. Associated small left pneumothorax and small amount of posterior subcutaneous air. Dependent atelectasis in the lungs bilaterally. Heart is borderline in size. Aorta is normal caliber. No mediastinal,  hilar, or axillary adenopathy.  Chest wall soft tissues otherwise unremarkable.  CT ABDOMEN AND PELVIS FINDINGS  There are fractures through the L2 through L5 left transverse processes. No visible pelvic fractures. Diffuse fatty infiltration of the liver without visible focal abnormality or injury. Spleen, pancreas, right adrenal, kidneys are unremarkable. Minimally complex cyst posteriorly in the right midpole. No hydronephrosis.  Nodular enlargement of the left adrenal gland. This measures 2.4 cm. There is slight surrounding stranding. The adrenal nodular density is high density, measuring 78 Hounsfield units. While this could reflect and adrenal nodule/lesion, I cannot exclude adrenal hemorrhage. Consider followup.  Gallbladder is unremarkable. Stomach, large and small bowel unremarkable. Lobular contours of an enlarged uterus compatible with fibroid uterus. No adnexal masses. Urinary bladder is unremarkable.  IMPRESSION: Left posterior 8th through 11th rib fractures. Associated tiny left-sided pneumothorax adjacent to the  left heart and in the left apex. Small amount of subcutaneous emphysema.  L2 through L5 left transverse process fractures.  High-density nodular enlargement of the left adrenal gland measuring 2.4 cm. While this could reflect a focal adrenal mass/lesion, I cannot exclude adrenal hemorrhage. This could be followed with repeat noncontrast CT in 2-3 months to assess for change.  Soft tissues/subcutaneous stranding hematoma in the left buttock.  Diffuse fatty infiltration of the liver.   Electronically Signed   By: Charlett Nose M.D.   On: 08/31/2013 13:20   Ct Cervical Spine Wo Contrast  08/31/2013   CLINICAL DATA:  Fall from roof.  Dizziness with nausea and vomiting.  EXAM: CT HEAD WITHOUT CONTRAST  CT CERVICAL SPINE WITHOUT CONTRAST  TECHNIQUE: Multidetector CT imaging of the head and cervical spine was performed following the standard protocol without intravenous contrast. Multiplanar CT image reconstructions of the cervical spine were also generated.  COMPARISON:  MRI brain 12/27/2011. Cervical spine radiographs 04/20/2006.  FINDINGS: CT HEAD FINDINGS  There is no evidence of acute intracranial hemorrhage, mass lesion, brain edema or extra-axial fluid collection. The ventricles and subarachnoid spaces are appropriately sized for age. There is no CT evidence of acute cortical infarction.  The visualized paranasal sinuses, mastoid air cells and middle ears are clear. Postsurgical changes are noted within the face. The calvarium is intact.  CT CERVICAL SPINE FINDINGS  The alignment is stable with mild straightening. There is no evidence of acute fracture or traumatic subluxation. There is generally stable multilevel spondylosis with disc space loss and uncinate spurring most advanced at C5-6 and C6-7. Mild foraminal narrowing is present at those levels. There is facet disease throughout the cervical spine. No acute soft tissue findings are demonstrated. The lung apices are clear.  IMPRESSION: 1. No acute  intracranial or calvarial findings. 2. No evidence of acute cervical spine fracture, traumatic subluxation or static signs of instability. 3. Stable cervical spondylosis.   Electronically Signed   By: Roxy Horseman M.D.   On: 08/31/2013 15:20   Ct Abdomen Pelvis W Contrast  08/31/2013   CLINICAL DATA:  Fall from roof.  Left sided pain.  EXAM: CT CHEST, ABDOMEN, AND PELVIS WITH CONTRAST  TECHNIQUE: Multidetector CT imaging of the chest, abdomen and pelvis was performed following the standard protocol during bolus administration of intravenous contrast.  CONTRAST:  OMNIPAQUE IOHEXOL 300 MG/ML  SOLN  COMPARISON:  None.  FINDINGS: CT CHEST FINDINGS  There are left rib fractures involving the 8th through 11th ribs posterolateral E. the 9th rib is displaced. Associated small left pneumothorax and small amount of posterior subcutaneous air. Dependent atelectasis  in the lungs bilaterally. Heart is borderline in size. Aorta is normal caliber. No mediastinal, hilar, or axillary adenopathy.  Chest wall soft tissues otherwise unremarkable.  CT ABDOMEN AND PELVIS FINDINGS  There are fractures through the L2 through L5 left transverse processes. No visible pelvic fractures. Diffuse fatty infiltration of the liver without visible focal abnormality or injury. Spleen, pancreas, right adrenal, kidneys are unremarkable. Minimally complex cyst posteriorly in the right midpole. No hydronephrosis.  Nodular enlargement of the left adrenal gland. This measures 2.4 cm. There is slight surrounding stranding. The adrenal nodular density is high density, measuring 78 Hounsfield units. While this could reflect and adrenal nodule/lesion, I cannot exclude adrenal hemorrhage. Consider followup.  Gallbladder is unremarkable. Stomach, large and small bowel unremarkable. Lobular contours of an enlarged uterus compatible with fibroid uterus. No adnexal masses. Urinary bladder is unremarkable.  IMPRESSION: Left posterior 8th through 11th rib  fractures. Associated tiny left-sided pneumothorax adjacent to the left heart and in the left apex. Small amount of subcutaneous emphysema.  L2 through L5 left transverse process fractures.  High-density nodular enlargement of the left adrenal gland measuring 2.4 cm. While this could reflect a focal adrenal mass/lesion, I cannot exclude adrenal hemorrhage. This could be followed with repeat noncontrast CT in 2-3 months to assess for change.  Soft tissues/subcutaneous stranding hematoma in the left buttock.  Diffuse fatty infiltration of the liver.   Electronically Signed   By: Charlett Nose M.D.   On: 08/31/2013 13:20   Dg Pelvis Portable  08/31/2013   CLINICAL DATA:  History of fall complaining of pain in the sacrum and low back.  EXAM: PORTABLE PELVIS 1-2 VIEWS  COMPARISON:  No priors.  FINDINGS: Single AP view of the pelvis demonstrates no acute displaced fractures of the bony pelvis. Bilateral proximal femurs as visualized appear intact, and the visualized portions of the lumbar spine are unremarkable.  IMPRESSION: 1. No acute radiographic abnormality of the bony pelvis.   Electronically Signed   By: Trudie Reed M.D.   On: 08/31/2013 12:18   Dg Chest Port 1 View  09/01/2013   CLINICAL DATA:  Fall from roof.  EXAM: PORTABLE CHEST - 1 VIEW  COMPARISON:  08/31/2013  FINDINGS: 0505 hours. Right lung is clear. These tiny left pneumothorax seen on yesterday's chest CT is not evident on the x-ray today. Posterior lower left rib fractures again noted and there is some atelectasis at the left base. Cardiopericardial silhouette is at upper limits of normal for size. Telemetry leads overlie the chest.  IMPRESSION: Left base atelectasis. The tiny left-sided pneumothorax seen on the CT scan yesterday is not evident on today's x-ray.   Electronically Signed   By: Kennith Center M.D.   On: 09/01/2013 07:13   Dg Chest Portable 1 View  08/31/2013   CLINICAL DATA:  History of fall complaining of posterior left rib  pain.  EXAM: PORTABLE CHEST - 1 VIEW  COMPARISON:  No priors.  FINDINGS: Lung volumes are normal. No consolidative airspace disease. No pleural effusions. No pneumothorax. No pulmonary nodule or mass noted. Pulmonary vasculature and the cardiomediastinal silhouette are within normal limits. There are acute fractures through the posterolateral aspect of the left 8th rib and through both the posterior and lateral aspects left 9th rib, which appear minimally displaced.  IMPRESSION: 1. Acute minimally displaced left-sided rib fractures involving the 8th and 9th ribs, as discussed above. No associated pneumothorax.   Electronically Signed   By: Brayton Mars.D.  On: 08/31/2013 12:28    EKG Interpretation    Date/Time:    Ventricular Rate:    PR Interval:    QRS Duration:   QT Interval:    QTC Calculation:   R Axis:     Text Interpretation:              MDM   1. Rib fractures, left, closed, initial encounter   2. Pneumothorax   3. Multiple transverse process fractures    Patient presents following a fall.  She is AOx3 and ABCs are intact.  Exam notable for left sided TTP over the chest.  No evidence of head or neck trauma.  Neurologically intact.  CT ab/pelvic ordered.  Patient given pain medication.  CT notable for multiple rib fractures, a small pneumothorax and multiple TP fractures.  Trauma was consulted and is requesting CT head and neck.  Anticipate patient to be admitted to trauma service.    Shon Baton, MD 09/01/13 1610  Shon Baton, MD 09/01/13 [phone]

## 2013-08-31 NOTE — ED Notes (Signed)
CCM physician at bedside, c-spine immobilized while aspen collar placed.

## 2013-08-31 NOTE — ED Notes (Addendum)
Pt reports landed on concrete. Denies LOC. C/o left posterior rib pain & left hip pain. No visible shortening or rotation noted to LLE. C/o increase pain with deep breaths. Abrasions noted to bilateral elbows & left knee.

## 2013-08-31 NOTE — Progress Notes (Signed)
Orthopedic Tech Progress Note Patient Details:  [name] 1956/01/28 [id number]  Patient ID: Caitlin Cox, female   DOB: 11-15-1955, 57 y.o.   MRN: [id number] Called [employer] with brace order; spoke with Terald Sleeper, [name] 08/31/2013, 4:05 PM

## 2013-08-31 NOTE — Consult Note (Addendum)
Physical Exam   History   Chief Complaint: back pain, chest pains and nausea  Caitlin Cox is a 57 year old female with a history of asthma who presented to [facility] as a non trauma following a fall off her roof this morning. The patient states that while trying to clean the gutters, the latter slipped and she fell approximately 8 feet. She landed on her left side and consequently hit her head as well. She denies LOC. She complained of right sided vision changes characterized as "ocular migraines." This resolves in the ambulance en route to the ED. She complained of back pain. She was in a c collar. She was given of Fentanyl in the ambulance. She was given dilaudid in the ED which helped with the pain. At present time. She complains of nausea in addition to the symptoms above. She denies neck pain, paresthesias, radiculopathy, loss of bowel or bladder control. She denies abdominal pain. She denies of anticoagulation. She underwent a XR of chest and pelvis which showed a normal pelvis, acute minimally displaced left sided rib fractures. At CT of chest showed a left 8-11th rib fractures and tiny left side PTX at the apex of the lung. It also showed L2-L5 left transverse process. A CT of abdomen revealed a left adrenal gland density which could represent a hemorrhage or a lesion. She reports her pain as severe. Mostly with turning or taking a deep breath in. She complains of shortness of breath, oxygenating at 95% on 2L. Her vital signs are stable. Laboratory evaluation showed a elevated white count, stable hemoglobin and hematocrit.  Chief Complaint   Patient presents with   .  Fall   .  Rib Injury    Fall  Pertinent negatives include no abdominal pain, chills, coughing, fever, headaches, neck pain, vomiting or weakness. The symptoms are aggravated by standing and twisting. She has tried nothing for the symptoms.  Fall  The symptoms are aggravated by standing and twisting. Pertinent negatives  include no abdominal pain, fever, headaches, hematuria, loss of consciousness, tingling or vomiting. She has tried nothing for the symptoms.   Past Medical History   Diagnosis  Date   .  Asthma    .  Thyroid disease     Past Surgical History   Procedure  Laterality  Date   .  Mandible fracture surgery     .  Foot surgery     .  Myomectomy      Family History   Problem  Relation  Age of Onset   .  Heart attack  Mother    .  Diabetes  Mother    .  Heart attack  Father    .  Hyperlipidemia  Father    .  Hypertension  Neg Hx    .  Sudden death  Neg Hx     Social History: reports that she has never smoked. She has never used smokeless tobacco. She reports that she drinks alcohol. She reports that she does not use illicit drugs.  Allergies    Allergies   Allergen  Reactions   .  Chlorzoxazone    .  Doxycycline    .  Oxycodone-Acetaminophen  Nausea And Vomiting   .  Penicillins     Home Medications   Advair 2 puffs twice daily  Trauma Course    Results for orders placed during the hospital encounter of 08/31/13 (from the past 48 hour(s))   CBC WITH DIFFERENTIAL Status: Abnormal  Collection Time    08/31/13 11:03 AM   Result  Value  Range    WBC  17.7 (*)  4.0 - 10.5 K/uL    RBC  4.38  3.87 - 5.11 MIL/uL    Hemoglobin  15.3 (*)  12.0 - 15.0 g/dL    HCT  16.1  09.6 - 04.5 %    MCV  98.6  78.0 - 100.0 fL    MCH  34.9 (*)  26.0 - 34.0 pg    MCHC  35.4  30.0 - 36.0 g/dL    RDW  40.9  81.1 - 91.4 %    Platelets  276  150 - 400 K/uL    Neutrophils Relative %  78 (*)  43 - 77 %    Neutro Abs  13.8 (*)  1.7 - 7.7 K/uL    Lymphocytes Relative  16  12 - 46 %    Lymphs Abs  2.7  0.7 - 4.0 K/uL    Monocytes Relative  5  3 - 12 %    Monocytes Absolute  0.8  0.1 - 1.0 K/uL    Eosinophils Relative  2  0 - 5 %    Eosinophils Absolute  0.3  0.0 - 0.7 K/uL    Basophils Relative  0  0 - 1 %    Basophils Absolute  0.1  0.0 - 0.1 K/uL   CG4 I-STAT (LACTIC ACID) Status: None     Collection Time    08/31/13 11:40 AM   Result  Value  Range    Lactic Acid, Venous  1.81  0.5 - 2.2 mmol/L   POCT I-STAT, CHEM 8 Status: Abnormal    Collection Time    08/31/13 11:40 AM   Result  Value  Range    Sodium  136  135 - 145 mEq/L    Potassium  4.7  3.5 - 5.1 mEq/L    Chloride  101  96 - 112 mEq/L    BUN  17  6 - 23 mg/dL    Creatinine, Ser  7.82  0.50 - 1.10 mg/dL    Glucose, Bld  956 (*)  70 - 99 mg/dL    Calcium, Ion  2.13  1.12 - 1.23 mmol/L    TCO2  22  0 - 100 mmol/L    Hemoglobin  16.3 (*)  12.0 - 15.0 g/dL    HCT  08.6 (*)  57.8 - 46.0 %    Ct Chest W Contrast  08/31/2013 CLINICAL DATA: Fall from roof. Left sided pain. EXAM: CT CHEST, ABDOMEN, AND PELVIS WITH CONTRAST TECHNIQUE: Multidetector CT imaging of the chest, abdomen and pelvis was performed following the standard protocol during bolus administration of intravenous contrast. CONTRAST: OMNIPAQUE IOHEXOL 300 MG/ML SOLN COMPARISON: None. FINDINGS: CT CHEST FINDINGS There are left rib fractures involving the 8th through 11th ribs posterolateral E. the 9th rib is displaced. Associated small left pneumothorax and small amount of posterior subcutaneous air. Dependent atelectasis in the lungs bilaterally. Heart is borderline in size. Aorta is normal caliber. No mediastinal, hilar, or axillary adenopathy. Chest wall soft tissues otherwise unremarkable. CT ABDOMEN AND PELVIS FINDINGS There are fractures through the L2 through L5 left transverse processes. No visible pelvic fractures. Diffuse fatty infiltration of the liver without visible focal abnormality or injury. Spleen, pancreas, right adrenal, kidneys are unremarkable. Minimally complex cyst posteriorly in the right midpole. No hydronephrosis. Nodular enlargement of the left adrenal gland. This measures 2.4 cm. There is  slight surrounding stranding. The adrenal nodular density is high density, measuring 78 Hounsfield units. While this could reflect and adrenal  nodule/lesion, I cannot exclude adrenal hemorrhage. Consider followup. Gallbladder is unremarkable. Stomach, large and small bowel unremarkable. Lobular contours of an enlarged uterus compatible with fibroid uterus. No adnexal masses. Urinary bladder is unremarkable. IMPRESSION: Left posterior 8th through 11th rib fractures. Associated tiny left-sided pneumothorax adjacent to the left heart and in the left apex. Small amount of subcutaneous emphysema. L2 through L5 left transverse process fractures. High-density nodular enlargement of the left adrenal gland measuring 2.4 cm. While this could reflect a focal adrenal mass/lesion, I cannot exclude adrenal hemorrhage. This could be followed with repeat noncontrast CT in 2-3 months to assess for change. Soft tissues/subcutaneous stranding hematoma in the left buttock. Diffuse fatty infiltration of the liver. Electronically Signed By: Charlett Nose M.D. On: 08/31/2013 13:20  Ct Abdomen Pelvis W Contrast  08/31/2013 CLINICAL DATA: Fall from roof. Left sided pain. EXAM: CT CHEST, ABDOMEN, AND PELVIS WITH CONTRAST TECHNIQUE: Multidetector CT imaging of the chest, abdomen and pelvis was performed following the standard protocol during bolus administration of intravenous contrast. CONTRAST: OMNIPAQUE IOHEXOL 300 MG/ML SOLN COMPARISON: None. FINDINGS: CT CHEST FINDINGS There are left rib fractures involving the 8th through 11th ribs posterolateral E. the 9th rib is displaced. Associated small left pneumothorax and small amount of posterior subcutaneous air. Dependent atelectasis in the lungs bilaterally. Heart is borderline in size. Aorta is normal caliber. No mediastinal, hilar, or axillary adenopathy. Chest wall soft tissues otherwise unremarkable. CT ABDOMEN AND PELVIS FINDINGS There are fractures through the L2 through L5 left transverse processes. No visible pelvic fractures. Diffuse fatty infiltration of the liver without visible focal abnormality or injury. Spleen,  pancreas, right adrenal, kidneys are unremarkable. Minimally complex cyst posteriorly in the right midpole. No hydronephrosis. Nodular enlargement of the left adrenal gland. This measures 2.4 cm. There is slight surrounding stranding. The adrenal nodular density is high density, measuring 78 Hounsfield units. While this could reflect and adrenal nodule/lesion, I cannot exclude adrenal hemorrhage. Consider followup. Gallbladder is unremarkable. Stomach, large and small bowel unremarkable. Lobular contours of an enlarged uterus compatible with fibroid uterus. No adnexal masses. Urinary bladder is unremarkable. IMPRESSION: Left posterior 8th through 11th rib fractures. Associated tiny left-sided pneumothorax adjacent to the left heart and in the left apex. Small amount of subcutaneous emphysema. L2 through L5 left transverse process fractures. High-density nodular enlargement of the left adrenal gland measuring 2.4 cm. While this could reflect a focal adrenal mass/lesion, I cannot exclude adrenal hemorrhage. This could be followed with repeat noncontrast CT in 2-3 months to assess for change. Soft tissues/subcutaneous stranding hematoma in the left buttock. Diffuse fatty infiltration of the liver. Electronically Signed By: Charlett Nose M.D. On: 08/31/2013 13:20  Dg Pelvis Portable  08/31/2013 CLINICAL DATA: History of fall complaining of pain in the sacrum and low back. EXAM: PORTABLE PELVIS 1-2 VIEWS COMPARISON: No priors. FINDINGS: Single AP view of the pelvis demonstrates no acute displaced fractures of the bony pelvis. Bilateral proximal femurs as visualized appear intact, and the visualized portions of the lumbar spine are unremarkable. IMPRESSION: 1. No acute radiographic abnormality of the bony pelvis. Electronically Signed By: Trudie Reed M.D. On: 08/31/2013 12:18  Dg Chest Portable 1 View  08/31/2013 CLINICAL DATA: History of fall complaining of posterior left rib pain. EXAM: PORTABLE CHEST - 1 VIEW  COMPARISON: No priors. FINDINGS: Lung volumes are normal. No consolidative airspace disease. No pleural effusions.  No pneumothorax. No pulmonary nodule or mass noted. Pulmonary vasculature and the cardiomediastinal silhouette are within normal limits. There are acute fractures through the posterolateral aspect of the left 8th rib and through both the posterior and lateral aspects left 9th rib, which appear minimally displaced. IMPRESSION: 1. Acute minimally displaced left-sided rib fractures involving the 8th and 9th ribs, as discussed above. No associated pneumothorax. Electronically Signed By: Trudie Reed M.D. On: 08/31/2013 12:28   Review of Systems  Constitutional: Negative for fever, chills, weight loss and malaise/fatigue.  HENT: Negative for hearing loss and tinnitus.  Eyes: Negative for blurred vision, double vision, photophobia, pain, discharge and redness.  Respiratory: Negative for cough, wheezing and stridor.  Cardiovascular: Negative for palpitations, leg swelling and PND.  Gastrointestinal: Negative for vomiting, abdominal pain, diarrhea, constipation, blood in stool and melena.  Genitourinary: Negative for dysuria, urgency, frequency, hematuria and flank pain.  Musculoskeletal: Negative for neck pain.  Neurological: Negative for dizziness, tingling, tremors, seizures, loss of consciousness, weakness and headaches.  Psychiatric/Behavioral: Negative for memory loss.   Blood pressure 143/76, pulse 83, temperature 97.5 F (36.4 C), temperature source Oral, resp. rate 18, height 5\' 4"  (1.626 m), weight 170 lb (77.111 kg), SpO2 100.00%.  Physical Exam  Constitutional: She is oriented to person, place, and time. She appears well-developed and well-nourished. No distress.  HENT:  Head: Normocephalic and atraumatic.  Right Ear: External ear normal.  Left Ear: External ear normal.  Nose: Nose normal.  Mouth/Throat: Oropharynx is clear and moist. No oropharyngeal exudate.  Eyes:  Conjunctivae and EOM are normal. Pupils are equal, round, and reactive to light. Right eye exhibits no discharge. Left eye exhibits no discharge. No scleral icterus.  Neck: Normal range of motion. Neck supple. No tracheal deviation present.  Cardiovascular: Normal rate, regular rhythm, normal heart sounds and intact distal pulses. Exam reveals no gallop and no friction rub.  No murmur heard.  Respiratory: Effort normal. No stridor. No respiratory distress. She has no wheezes. She has no rales. She exhibits tenderness.  Left lateral chest wall tenderness  GI: Soft. Bowel sounds are normal. She exhibits no distension and no mass. There is no tenderness. There is no rebound and no guarding.  Musculoskeletal: Normal range of motion. She exhibits no edema.  TTP lumbar spine, without obvious abnormalities. SLR are normal.  Lymphadenopathy:  She has no cervical adenopathy.  Neurological: She is alert and oriented to person, place, and time. She has normal strength and normal reflexes. Coordination normal. GCS eye subscore is 4. GCS verbal subscore is 5. GCS motor subscore is 6.  Skin: Skin is warm and dry. She is not diaphoretic.  Abrasion and ecchymosis to left elbow Abrasion to left anterior leg, distal portion. Both are superficial and without bleeding or foreign objects.  Psychiatric: She has a normal mood and affect. Her behavior is normal. Judgment and thought content normal.   Assessment/Plan  Fall, approximately 70ft from ladder  -ct of head is negative.  -C-spine cleared  Left sided 8-11 rib fractures  Small apical pneumothorax  -admit to SDU to monitor respiratory status closely  -repeat CXR in AM to ensure stability.  -pain control, IV for now since she has n/v. She has severe nausea with oxycodone.  -aggressive pulmonary toilet  -PT OK to proceed.  L2-L5 TVP fractures  Non operative, bed rest for now, brace ordered, ortho tech can apply tomorrow. OK to mobilize in brace tomorrow.  These fractures are frequently painful, but do not require surgery  and will heal with time.  Left adrenal gland hemorrhage v. Nodule  -she has a benign abdominal exam. Continue to monitor.  -she will need a repeat CT in 3-6 months to ensure that this is indeed a hemorrhage rather than a nodule.  -lovenox starting tomorrow  Hypothyroidism  -continue synthroid  Asthma  -continue advair  Maeola Harman DAVID M.D.  08/31/2013  Procedures  none

## 2013-08-31 NOTE — H&P (Addendum)
History  Chief Complaint: back pain, chest pains and nausea  Caitlin Cox is a 57 year old female with a history of asthma who presented to [facility] as a non trauma following a fall off her roof this morning.  The patient states that while trying to clean the gutters, the latter slipped and she fell approximately 8 feet.  She landed on her left side and consequently hit her head as well.  She denies LOC.  She complained of right sided vision changes characterized as "ocular migraines."  This resolves in the ambulance en route to the ED.  She complained of back pain.  She was in a c collar.  She was given of Fentanyl in the ambulance.  She was given dilaudid in the ED which helped with the pain.  At present time.  She complains of nausea in addition to the symptoms above.  She denies neck pain, paresthesias, radiculopathy, loss of bowel or bladder control.  She denies abdominal pain.  She denies of anticoagulation.  She underwent a XR of chest and pelvis which showed a normal pelvis, acute minimally displaced left sided rib fractures.  At CT of chest showed a left 8-11th rib fractures and tiny left side PTX at the apex of the lung.  It also showed L2-L5 left transverse process.  A CT of abdomen revealed a left adrenal gland density which could represent a hemorrhage or a lesion.  She reports her pain as severe.  Mostly with turning or taking a deep breath in.  She complains of shortness of breath, oxygenating at 95% on 2L.  Her vital signs are stable.  Laboratory evaluation showed a elevated white count, stable hemoglobin and hematocrit.    Chief Complaint  Patient presents with  . Fall  . Rib Injury    Fall Pertinent negatives include no abdominal pain, chills, coughing, fever, headaches, neck pain, vomiting or weakness. The symptoms are aggravated by standing and twisting. She has tried nothing for the symptoms.  Fall The symptoms are aggravated by standing and twisting. Pertinent negatives  include no abdominal pain, fever, headaches, hematuria, loss of consciousness, tingling or vomiting. She has tried nothing for the symptoms.    Past Medical History  Diagnosis Date  . Asthma   . Thyroid disease     Past Surgical History  Procedure Laterality Date  . Mandible fracture surgery    . Foot surgery    . Myomectomy      Family History  Problem Relation Age of Onset  . Heart attack Mother   . Diabetes Mother   . Heart attack Father   . Hyperlipidemia Father   . Hypertension Neg Hx   . Sudden death Neg Hx    Social History:  reports that she has never smoked. She has never used smokeless tobacco. She reports that she drinks alcohol. She reports that she does not use illicit drugs.  Allergies   Allergies  Allergen Reactions  . Chlorzoxazone   . Doxycycline   . Oxycodone-Acetaminophen Nausea And Vomiting  . Penicillins     Home Medications  Advair 2 puffs twice daily  Trauma Course   Results for orders placed during the hospital encounter of 08/31/13 (from the past 48 hour(s))  CBC WITH DIFFERENTIAL     Status: Abnormal   Collection Time    08/31/13 11:03 AM      Result Value Range   WBC 17.7 (*) 4.0 - 10.5 K/uL   RBC 4.38  3.87 -  5.11 MIL/uL   Hemoglobin 15.3 (*) 12.0 - 15.0 g/dL   HCT 40.9  81.1 - 91.4 %   MCV 98.6  78.0 - 100.0 fL   MCH 34.9 (*) 26.0 - 34.0 pg   MCHC 35.4  30.0 - 36.0 g/dL   RDW 78.2  95.6 - 21.3 %   Platelets 276  150 - 400 K/uL   Neutrophils Relative % 78 (*) 43 - 77 %   Neutro Abs 13.8 (*) 1.7 - 7.7 K/uL   Lymphocytes Relative 16  12 - 46 %   Lymphs Abs 2.7  0.7 - 4.0 K/uL   Monocytes Relative 5  3 - 12 %   Monocytes Absolute 0.8  0.1 - 1.0 K/uL   Eosinophils Relative 2  0 - 5 %   Eosinophils Absolute 0.3  0.0 - 0.7 K/uL   Basophils Relative 0  0 - 1 %   Basophils Absolute 0.1  0.0 - 0.1 K/uL  CG4 I-STAT (LACTIC ACID)     Status: None   Collection Time    08/31/13 11:40 AM      Result Value Range   Lactic Acid, Venous  1.81  0.5 - 2.2 mmol/L  POCT I-STAT, CHEM 8     Status: Abnormal   Collection Time    08/31/13 11:40 AM      Result Value Range   Sodium 136  135 - 145 mEq/L   Potassium 4.7  3.5 - 5.1 mEq/L   Chloride 101  96 - 112 mEq/L   BUN 17  6 - 23 mg/dL   Creatinine, Ser 0.86  0.50 - 1.10 mg/dL   Glucose, Bld 578 (*) 70 - 99 mg/dL   Calcium, Ion 4.69  6.29 - 1.23 mmol/L   TCO2 22  0 - 100 mmol/L   Hemoglobin 16.3 (*) 12.0 - 15.0 g/dL   HCT 52.8 (*) 41.3 - 24.4 %   Ct Chest W Contrast  08/31/2013   CLINICAL DATA:  Fall from roof.  Left sided pain.  EXAM: CT CHEST, ABDOMEN, AND PELVIS WITH CONTRAST  TECHNIQUE: Multidetector CT imaging of the chest, abdomen and pelvis was performed following the standard protocol during bolus administration of intravenous contrast.  CONTRAST:  OMNIPAQUE IOHEXOL 300 MG/ML  SOLN  COMPARISON:  None.  FINDINGS: CT CHEST FINDINGS  There are left rib fractures involving the 8th through 11th ribs posterolateral E. the 9th rib is displaced. Associated small left pneumothorax and small amount of posterior subcutaneous air. Dependent atelectasis in the lungs bilaterally. Heart is borderline in size. Aorta is normal caliber. No mediastinal, hilar, or axillary adenopathy.  Chest wall soft tissues otherwise unremarkable.  CT ABDOMEN AND PELVIS FINDINGS  There are fractures through the L2 through L5 left transverse processes. No visible pelvic fractures. Diffuse fatty infiltration of the liver without visible focal abnormality or injury. Spleen, pancreas, right adrenal, kidneys are unremarkable. Minimally complex cyst posteriorly in the right midpole. No hydronephrosis.  Nodular enlargement of the left adrenal gland. This measures 2.4 cm. There is slight surrounding stranding. The adrenal nodular density is high density, measuring 78 Hounsfield units. While this could reflect and adrenal nodule/lesion, I cannot exclude adrenal hemorrhage. Consider followup.  Gallbladder is  unremarkable. Stomach, large and small bowel unremarkable. Lobular contours of an enlarged uterus compatible with fibroid uterus. No adnexal masses. Urinary bladder is unremarkable.  IMPRESSION: Left posterior 8th through 11th rib fractures. Associated tiny left-sided pneumothorax adjacent to the left heart and in the  left apex. Small amount of subcutaneous emphysema.  L2 through L5 left transverse process fractures.  High-density nodular enlargement of the left adrenal gland measuring 2.4 cm. While this could reflect a focal adrenal mass/lesion, I cannot exclude adrenal hemorrhage. This could be followed with repeat noncontrast CT in 2-3 months to assess for change.  Soft tissues/subcutaneous stranding hematoma in the left buttock.  Diffuse fatty infiltration of the liver.   Electronically Signed   By: Charlett Nose M.D.   On: 08/31/2013 13:20   Ct Abdomen Pelvis W Contrast  08/31/2013   CLINICAL DATA:  Fall from roof.  Left sided pain.  EXAM: CT CHEST, ABDOMEN, AND PELVIS WITH CONTRAST  TECHNIQUE: Multidetector CT imaging of the chest, abdomen and pelvis was performed following the standard protocol during bolus administration of intravenous contrast.  CONTRAST:  OMNIPAQUE IOHEXOL 300 MG/ML  SOLN  COMPARISON:  None.  FINDINGS: CT CHEST FINDINGS  There are left rib fractures involving the 8th through 11th ribs posterolateral E. the 9th rib is displaced. Associated small left pneumothorax and small amount of posterior subcutaneous air. Dependent atelectasis in the lungs bilaterally. Heart is borderline in size. Aorta is normal caliber. No mediastinal, hilar, or axillary adenopathy.  Chest wall soft tissues otherwise unremarkable.  CT ABDOMEN AND PELVIS FINDINGS  There are fractures through the L2 through L5 left transverse processes. No visible pelvic fractures. Diffuse fatty infiltration of the liver without visible focal abnormality or injury. Spleen, pancreas, right adrenal, kidneys are unremarkable.  Minimally complex cyst posteriorly in the right midpole. No hydronephrosis.  Nodular enlargement of the left adrenal gland. This measures 2.4 cm. There is slight surrounding stranding. The adrenal nodular density is high density, measuring 78 Hounsfield units. While this could reflect and adrenal nodule/lesion, I cannot exclude adrenal hemorrhage. Consider followup.  Gallbladder is unremarkable. Stomach, large and small bowel unremarkable. Lobular contours of an enlarged uterus compatible with fibroid uterus. No adnexal masses. Urinary bladder is unremarkable.  IMPRESSION: Left posterior 8th through 11th rib fractures. Associated tiny left-sided pneumothorax adjacent to the left heart and in the left apex. Small amount of subcutaneous emphysema.  L2 through L5 left transverse process fractures.  High-density nodular enlargement of the left adrenal gland measuring 2.4 cm. While this could reflect a focal adrenal mass/lesion, I cannot exclude adrenal hemorrhage. This could be followed with repeat noncontrast CT in 2-3 months to assess for change.  Soft tissues/subcutaneous stranding hematoma in the left buttock.  Diffuse fatty infiltration of the liver.   Electronically Signed   By: Charlett Nose M.D.   On: 08/31/2013 13:20   Dg Pelvis Portable  08/31/2013   CLINICAL DATA:  History of fall complaining of pain in the sacrum and low back.  EXAM: PORTABLE PELVIS 1-2 VIEWS  COMPARISON:  No priors.  FINDINGS: Single AP view of the pelvis demonstrates no acute displaced fractures of the bony pelvis. Bilateral proximal femurs as visualized appear intact, and the visualized portions of the lumbar spine are unremarkable.  IMPRESSION: 1. No acute radiographic abnormality of the bony pelvis.   Electronically Signed   By: Trudie Reed M.D.   On: 08/31/2013 12:18   Dg Chest Portable 1 View  08/31/2013   CLINICAL DATA:  History of fall complaining of posterior left rib pain.  EXAM: PORTABLE CHEST - 1 VIEW  COMPARISON:   No priors.  FINDINGS: Lung volumes are normal. No consolidative airspace disease. No pleural effusions. No pneumothorax. No pulmonary nodule or mass noted. Pulmonary  vasculature and the cardiomediastinal silhouette are within normal limits. There are acute fractures through the posterolateral aspect of the left 8th rib and through both the posterior and lateral aspects left 9th rib, which appear minimally displaced.  IMPRESSION: 1. Acute minimally displaced left-sided rib fractures involving the 8th and 9th ribs, as discussed above. No associated pneumothorax.   Electronically Signed   By: Trudie Reed M.D.   On: 08/31/2013 12:28    Review of Systems  Constitutional: Negative for fever, chills, weight loss and malaise/fatigue.  HENT: Negative for hearing loss and tinnitus.   Eyes: Negative for blurred vision, double vision, photophobia, pain, discharge and redness.  Respiratory: Negative for cough, wheezing and stridor.   Cardiovascular: Negative for palpitations, leg swelling and PND.  Gastrointestinal: Negative for vomiting, abdominal pain, diarrhea, constipation, blood in stool and melena.  Genitourinary: Negative for dysuria, urgency, frequency, hematuria and flank pain.  Musculoskeletal: Negative for neck pain.  Neurological: Negative for dizziness, tingling, tremors, seizures, loss of consciousness, weakness and headaches.  Psychiatric/Behavioral: Negative for memory loss.    Blood pressure 143/76, pulse 83, temperature 97.5 F (36.4 C), temperature source Oral, resp. rate 18, height 5\' 4"  (1.626 m), weight 170 lb (77.111 kg), SpO2 100.00%. Physical Exam  Constitutional: She is oriented to person, place, and time. She appears well-developed and well-nourished. No distress.  HENT:  Head: Normocephalic and atraumatic.  Right Ear: External ear normal.  Left Ear: External ear normal.  Nose: Nose normal.  Mouth/Throat: Oropharynx is clear and moist. No oropharyngeal exudate.  Eyes:  Conjunctivae and EOM are normal. Pupils are equal, round, and reactive to light. Right eye exhibits no discharge. Left eye exhibits no discharge. No scleral icterus.  Neck: Normal range of motion. Neck supple. No tracheal deviation present.  Cardiovascular: Normal rate, regular rhythm, normal heart sounds and intact distal pulses.  Exam reveals no gallop and no friction rub.   No murmur heard. Respiratory: Effort normal. No stridor. No respiratory distress. She has no wheezes. She has no rales. She exhibits tenderness.  Left lateral chest wall tenderness  GI: Soft. Bowel sounds are normal. She exhibits no distension and no mass. There is no tenderness. There is no rebound and no guarding.  Musculoskeletal: Normal range of motion. She exhibits no edema.  TTP lumbar spine, without obvious abnormalities.  SLR are normal.    Lymphadenopathy:    She has no cervical adenopathy.  Neurological: She is alert and oriented to person, place, and time. She has normal strength and normal reflexes. Coordination normal. GCS eye subscore is 4. GCS verbal subscore is 5. GCS motor subscore is 6.  Skin: Skin is warm and dry. She is not diaphoretic.  Abrasion and ecchymosis to left elbow Abrasion to left anterior leg, distal portion.  Both are superficial and without bleeding or foreign objects.  Psychiatric: She has a normal mood and affect. Her behavior is normal. Judgment and thought content normal.     Assessment/Plan Fall, approximately 46ft from ladder -ct of head is negative. -C-spine cleared Left sided 8-11 rib fractures Small apical pneumothorax -admit to SDU to monitor respiratory status closely -repeat CXR in AM to ensure stability. -pain control, IV for now since she has n/v.  She has severe nausea with oxycodone.  -aggressive pulmonary toilet -PT OK to proceed.  L2-L5 TVP fractures Non operative, bed rest for now, brace ordered, ortho tech can apply tomorrow.  OK to mobilize in brace tomorrow.   These fractures are frequently painful, but  do not require surgery and will heal with time. Left adrenal gland hemorrhage v. Nodule -she has a benign abdominal exam.  Continue to monitor. -she will need a repeat CT in 3-6 months to ensure that this is indeed a hemorrhage rather than a nodule. -lovenox starting tomorrow Hypothyroidism -continue synthroid Asthma -continue advair  Mary Sella. Andrey Campanile, MD, FACS General, Bariatric, & Minimally Invasive Surgery [facility]   Procedures none

## 2013-08-31 NOTE — ED Notes (Signed)
Fell approx 7-8 feet off a ladder onto concrete. Denies LOC. Pt got self into house to call EMS. C/o back & left rib area pain. [facility] gave fentanyl IV enroute for pain.

## 2013-08-31 NOTE — ED Notes (Signed)
Patient transported to CT/Xray. 

## 2013-08-31 NOTE — ED Notes (Signed)
Patient transported to CT 

## 2013-08-31 NOTE — ED Notes (Signed)
Rembert, Ortho Tech informed & aware of need for TLSO brace per order. Stated [employer] rep aware & will fit pt with TLSO brace tomorrow morning.

## 2013-08-31 NOTE — ED Notes (Signed)
ED MD at bedside. Log rolled & KED removed. C-collar remains in place

## 2013-09-01 ENCOUNTER — Inpatient Hospital Stay (HOSPITAL_COMMUNITY): Payer: 59

## 2013-09-01 DIAGNOSIS — J939 Pneumothorax, unspecified: Secondary | ICD-10-CM | POA: Diagnosis present

## 2013-09-01 DIAGNOSIS — S32009A Unspecified fracture of unspecified lumbar vertebra, initial encounter for closed fracture: Secondary | ICD-10-CM

## 2013-09-01 DIAGNOSIS — S2242XA Multiple fractures of ribs, left side, initial encounter for closed fracture: Secondary | ICD-10-CM

## 2013-09-01 LAB — BASIC METABOLIC PANEL
BUN: 12 mg/dL (ref 6–23)
CO2: 25 mEq/L (ref 19–32)
Calcium: 8.5 mg/dL (ref 8.4–10.5)
GFR calc Af Amer: 73 mL/min — ABNORMAL LOW (ref >90)
GFR calc non Af Amer: 63 mL/min — ABNORMAL LOW (ref >90)
Glucose, Bld: 106 mg/dL — ABNORMAL HIGH (ref 70–99)
Potassium: 4 mEq/L (ref 3.5–5.1)
Sodium: 134 mEq/L — ABNORMAL LOW (ref 135–145)

## 2013-09-01 LAB — CBC
Hemoglobin: 12.2 g/dL (ref 12.0–15.0)
MCH: 34 pg (ref 26.0–34.0)
MCHC: 34 g/dL (ref 30.0–36.0)
Platelets: 202 10*3/uL (ref 150–400)
RDW: 12.9 % (ref 11.5–15.5)

## 2013-09-01 MED ORDER — TIZANIDINE HCL 4 MG PO TABS
4.0000 mg | ORAL_TABLET | Freq: Three times a day (TID) | ORAL | Status: DC | PRN
  Administered 2013-09-01 (×2): 4 mg via ORAL
  Filled 2013-09-01 (×5): qty 1

## 2013-09-01 MED ORDER — HYDROCODONE-ACETAMINOPHEN 10-325 MG PO TABS
1.0000 | ORAL_TABLET | ORAL | Status: DC | PRN
  Administered 2013-09-01 – 2013-09-02 (×3): 1 via ORAL
  Filled 2013-09-01 (×3): qty 1

## 2013-09-01 MED ORDER — TRAMADOL HCL 50 MG PO TABS
50.0000 mg | ORAL_TABLET | Freq: Four times a day (QID) | ORAL | Status: DC
  Administered 2013-09-01 (×4): 50 mg via ORAL
  Filled 2013-09-01 (×4): qty 1

## 2013-09-01 NOTE — Progress Notes (Addendum)
Patient ID: Caitlin Cox, female   DOB: [birth year], 57 y.o.   MRN: [id number]    Subjective: Very sore but working with IS - 1250  Objective: Vital signs in last 24 hours: Temp:  [97.5 F (36.4 C)-99 F (37.2 C)] 99 F (37.2 C) (11/24 0732) Pulse Rate:  [71-83] 81 (11/24 0732) Resp:  [9-22] 15 (11/24 0732) BP: (113-161)/(68-99) 161/79 mmHg (11/24 0732) SpO2:  [92 %-100 %] 98 % (11/24 0732) Weight:  [170 lb (77.111 kg)-171 lb 15.3 oz (78 kg)] 171 lb 15.3 oz (78 kg) (11/24 0448) Last BM Date: 08/31/13  Intake/Output from previous day: 11/23 0701 - 11/24 0700 In: 973.8 [P.O.:360; I.V.:613.8] Out: 375 [Urine:375] Intake/Output this shift: Total I/O In: -  Out: 450 [Urine:450]  General appearance: alert and cooperative Resp: clear to auscultation bilaterally Chest wall: left sided chest wall tenderness Cardio: regular rate and rhythm GI: soft, nt, nd Neurologic: Mental status: Alert, oriented, thought content appropriate  Lab Results: CBC   Recent Labs  08/31/13 1103 08/31/13 1140 09/01/13 0538  WBC 17.7*  --  9.9  HGB 15.3* 16.3* 12.2  HCT 43.2 48.0* 35.9*  PLT 276  --  202   BMET  Recent Labs  08/31/13 1140 09/01/13 0538  NA 136 134*  K 4.7 4.0  CL 101 100  CO2  --  25  GLUCOSE 201* 106*  BUN 17 12  CREATININE 1.10 0.98  CALCIUM  --  8.5   PT/INR No results found for this basename: LABPROT, INR,  in the last 72 hours ABG No results found for this basename: PHART, PCO2, PO2, HCO3,  in the last 72 hours  Studies/Results: Ct Head Wo Contrast  08/31/2013   CLINICAL DATA:  Fall from roof.  Dizziness with nausea and vomiting.  EXAM: CT HEAD WITHOUT CONTRAST  CT CERVICAL SPINE WITHOUT CONTRAST  TECHNIQUE: Multidetector CT imaging of the head and cervical spine was performed following the standard protocol without intravenous contrast. Multiplanar CT image reconstructions of the cervical spine were also generated.  COMPARISON:  MRI brain 12/27/2011.  Cervical spine radiographs 04/20/2006.  FINDINGS: CT HEAD FINDINGS  There is no evidence of acute intracranial hemorrhage, mass lesion, brain edema or extra-axial fluid collection. The ventricles and subarachnoid spaces are appropriately sized for age. There is no CT evidence of acute cortical infarction.  The visualized paranasal sinuses, mastoid air cells and middle ears are clear. Postsurgical changes are noted within the face. The calvarium is intact.  CT CERVICAL SPINE FINDINGS  The alignment is stable with mild straightening. There is no evidence of acute fracture or traumatic subluxation. There is generally stable multilevel spondylosis with disc space loss and uncinate spurring most advanced at C5-6 and C6-7. Mild foraminal narrowing is present at those levels. There is facet disease throughout the cervical spine. No acute soft tissue findings are demonstrated. The lung apices are clear.  IMPRESSION: 1. No acute intracranial or calvarial findings. 2. No evidence of acute cervical spine fracture, traumatic subluxation or static signs of instability. 3. Stable cervical spondylosis.   Electronically Signed   By: Roxy Horseman M.D.   On: 08/31/2013 15:20   Ct Chest W Contrast  08/31/2013   CLINICAL DATA:  Fall from roof.  Left sided pain.  EXAM: CT CHEST, ABDOMEN, AND PELVIS WITH CONTRAST  TECHNIQUE: Multidetector CT imaging of the chest, abdomen and pelvis was performed following the standard protocol during bolus administration of intravenous contrast.  CONTRAST:  OMNIPAQUE IOHEXOL 300 MG/ML  SOLN  COMPARISON:  None.  FINDINGS: CT CHEST FINDINGS  There are left rib fractures involving the 8th through 11th ribs posterolateral E. the 9th rib is displaced. Associated small left pneumothorax and small amount of posterior subcutaneous air. Dependent atelectasis in the lungs bilaterally. Heart is borderline in size. Aorta is normal caliber. No mediastinal, hilar, or axillary adenopathy.  Chest wall soft  tissues otherwise unremarkable.  CT ABDOMEN AND PELVIS FINDINGS  There are fractures through the L2 through L5 left transverse processes. No visible pelvic fractures. Diffuse fatty infiltration of the liver without visible focal abnormality or injury. Spleen, pancreas, right adrenal, kidneys are unremarkable. Minimally complex cyst posteriorly in the right midpole. No hydronephrosis.  Nodular enlargement of the left adrenal gland. This measures 2.4 cm. There is slight surrounding stranding. The adrenal nodular density is high density, measuring 78 Hounsfield units. While this could reflect and adrenal nodule/lesion, I cannot exclude adrenal hemorrhage. Consider followup.  Gallbladder is unremarkable. Stomach, large and small bowel unremarkable. Lobular contours of an enlarged uterus compatible with fibroid uterus. No adnexal masses. Urinary bladder is unremarkable.  IMPRESSION: Left posterior 8th through 11th rib fractures. Associated tiny left-sided pneumothorax adjacent to the left heart and in the left apex. Small amount of subcutaneous emphysema.  L2 through L5 left transverse process fractures.  High-density nodular enlargement of the left adrenal gland measuring 2.4 cm. While this could reflect a focal adrenal mass/lesion, I cannot exclude adrenal hemorrhage. This could be followed with repeat noncontrast CT in 2-3 months to assess for change.  Soft tissues/subcutaneous stranding hematoma in the left buttock.  Diffuse fatty infiltration of the liver.   Electronically Signed   By: Charlett Nose M.D.   On: 08/31/2013 13:20   Ct Cervical Spine Wo Contrast  08/31/2013   CLINICAL DATA:  Fall from roof.  Dizziness with nausea and vomiting.  EXAM: CT HEAD WITHOUT CONTRAST  CT CERVICAL SPINE WITHOUT CONTRAST  TECHNIQUE: Multidetector CT imaging of the head and cervical spine was performed following the standard protocol without intravenous contrast. Multiplanar CT image reconstructions of the cervical spine were  also generated.  COMPARISON:  MRI brain 12/27/2011. Cervical spine radiographs 04/20/2006.  FINDINGS: CT HEAD FINDINGS  There is no evidence of acute intracranial hemorrhage, mass lesion, brain edema or extra-axial fluid collection. The ventricles and subarachnoid spaces are appropriately sized for age. There is no CT evidence of acute cortical infarction.  The visualized paranasal sinuses, mastoid air cells and middle ears are clear. Postsurgical changes are noted within the face. The calvarium is intact.  CT CERVICAL SPINE FINDINGS  The alignment is stable with mild straightening. There is no evidence of acute fracture or traumatic subluxation. There is generally stable multilevel spondylosis with disc space loss and uncinate spurring most advanced at C5-6 and C6-7. Mild foraminal narrowing is present at those levels. There is facet disease throughout the cervical spine. No acute soft tissue findings are demonstrated. The lung apices are clear.  IMPRESSION: 1. No acute intracranial or calvarial findings. 2. No evidence of acute cervical spine fracture, traumatic subluxation or static signs of instability. 3. Stable cervical spondylosis.   Electronically Signed   By: Roxy Horseman M.D.   On: 08/31/2013 15:20   Ct Abdomen Pelvis W Contrast  08/31/2013   CLINICAL DATA:  Fall from roof.  Left sided pain.  EXAM: CT CHEST, ABDOMEN, AND PELVIS WITH CONTRAST  TECHNIQUE: Multidetector CT imaging of the chest, abdomen and pelvis was performed following the standard protocol  during bolus administration of intravenous contrast.  CONTRAST:  OMNIPAQUE IOHEXOL 300 MG/ML  SOLN  COMPARISON:  None.  FINDINGS: CT CHEST FINDINGS  There are left rib fractures involving the 8th through 11th ribs posterolateral E. the 9th rib is displaced. Associated small left pneumothorax and small amount of posterior subcutaneous air. Dependent atelectasis in the lungs bilaterally. Heart is borderline in size. Aorta is normal caliber. No  mediastinal, hilar, or axillary adenopathy.  Chest wall soft tissues otherwise unremarkable.  CT ABDOMEN AND PELVIS FINDINGS  There are fractures through the L2 through L5 left transverse processes. No visible pelvic fractures. Diffuse fatty infiltration of the liver without visible focal abnormality or injury. Spleen, pancreas, right adrenal, kidneys are unremarkable. Minimally complex cyst posteriorly in the right midpole. No hydronephrosis.  Nodular enlargement of the left adrenal gland. This measures 2.4 cm. There is slight surrounding stranding. The adrenal nodular density is high density, measuring 78 Hounsfield units. While this could reflect and adrenal nodule/lesion, I cannot exclude adrenal hemorrhage. Consider followup.  Gallbladder is unremarkable. Stomach, large and small bowel unremarkable. Lobular contours of an enlarged uterus compatible with fibroid uterus. No adnexal masses. Urinary bladder is unremarkable.  IMPRESSION: Left posterior 8th through 11th rib fractures. Associated tiny left-sided pneumothorax adjacent to the left heart and in the left apex. Small amount of subcutaneous emphysema.  L2 through L5 left transverse process fractures.  High-density nodular enlargement of the left adrenal gland measuring 2.4 cm. While this could reflect a focal adrenal mass/lesion, I cannot exclude adrenal hemorrhage. This could be followed with repeat noncontrast CT in 2-3 months to assess for change.  Soft tissues/subcutaneous stranding hematoma in the left buttock.  Diffuse fatty infiltration of the liver.   Electronically Signed   By: Charlett Nose M.D.   On: 08/31/2013 13:20   Dg Pelvis Portable  08/31/2013   CLINICAL DATA:  History of fall complaining of pain in the sacrum and low back.  EXAM: PORTABLE PELVIS 1-2 VIEWS  COMPARISON:  No priors.  FINDINGS: Single AP view of the pelvis demonstrates no acute displaced fractures of the bony pelvis. Bilateral proximal femurs as visualized appear intact,  and the visualized portions of the lumbar spine are unremarkable.  IMPRESSION: 1. No acute radiographic abnormality of the bony pelvis.   Electronically Signed   By: Trudie Reed M.D.   On: 08/31/2013 12:18   Dg Chest Port 1 View  09/01/2013   CLINICAL DATA:  Fall from roof.  EXAM: PORTABLE CHEST - 1 VIEW  COMPARISON:  08/31/2013  FINDINGS: 0505 hours. Right lung is clear. These tiny left pneumothorax seen on yesterday's chest CT is not evident on the x-ray today. Posterior lower left rib fractures again noted and there is some atelectasis at the left base. Cardiopericardial silhouette is at upper limits of normal for size. Telemetry leads overlie the chest.  IMPRESSION: Left base atelectasis. The tiny left-sided pneumothorax seen on the CT scan yesterday is not evident on today's x-ray.   Electronically Signed   By: Kennith Center M.D.   On: 09/01/2013 07:13   Dg Chest Portable 1 View  08/31/2013   CLINICAL DATA:  History of fall complaining of posterior left rib pain.  EXAM: PORTABLE CHEST - 1 VIEW  COMPARISON:  No priors.  FINDINGS: Lung volumes are normal. No consolidative airspace disease. No pleural effusions. No pneumothorax. No pulmonary nodule or mass noted. Pulmonary vasculature and the cardiomediastinal silhouette are within normal limits. There are acute fractures through  the posterolateral aspect of the left 8th rib and through both the posterior and lateral aspects left 9th rib, which appear minimally displaced.  IMPRESSION: 1. Acute minimally displaced left-sided rib fractures involving the 8th and 9th ribs, as discussed above. No associated pneumothorax.   Electronically Signed   By: Trudie Reed M.D.   On: 08/31/2013 12:28    Anti-infectives: Anti-infectives   None      Assessment/Plan: Fall off roof L rib FX 8-11 with small PTX - no PTX on CXR this AM, pulmonary toilet, pain control L adrenal contusion FEN - clears as had emesis yesterday Lumbar TVP FXs 2-5 - pain  control, TLSO perDr. Venetia Maxon  VTE - PAS, start Lovenox Dispo - PT/OT. To floor, she lives alone   LOS: 1 day    Violeta Gelinas, MD, MPH, FACS Pager: [phone]  09/01/2013

## 2013-09-01 NOTE — Progress Notes (Signed)
Attempt to call report to 6N, nurse currently busy and will call back

## 2013-09-01 NOTE — Clinical Social Work Note (Signed)
Clinical Social Work Department BRIEF PSYCHOSOCIAL ASSESSMENT 09/01/2013  Patient:  Caitlin Cox, Caitlin Cox     Account Number:  [id number]     Admit date:  08/31/2013  Clinical Social Worker:  Verl Blalock  Date/Time:  09/01/2013 03:30 PM  Referred by:  Physician  Date Referred:  09/01/2013 Referred for  Psychosocial assessment   Other Referral:   Interview type:  Patient Other interview type:   No family/friends at bedside    PSYCHOSOCIAL DATA Living Status:  ALONE Admitted from facility:   Level of care:   Primary support name:  Rockne Coons  [phone] Primary support relationship to patient:  SIBLING Degree of support available:   Adequate    CURRENT CONCERNS Current Concerns  Post-Acute Placement   Other Concerns:    SOCIAL WORK ASSESSMENT / PLAN Clinical Social Worker met with patient at bedside to offer support and discuss patient needs at discharge.  Patient states that she was blowing leaves out of her gutter when the ladder fell out from under her.  Patient did not have time to even grab the gutter to break her fall.  Patient was able to crawl inside of the house and call for help. Patient currently lives at home alone.  Patient is scheduled to work with therapies tomorrow to determine appropriate plan at discharge.  Patient is hopeful for discharge home, however understands that rehab may be needed prior to return home alone.  Patient is agreeable to inpatient rehab and even SNF [facility]) if determined necessary once ready for discharge.  CSW will follow up with patient pending therapy recommendations.    Clinical Social Worker inquired about current substance use.  Patient states that she does like to drink wine, however does not express concerns regarding current use. SBIRT complete.  No resources needed at this time.   Assessment/plan status:  Psychosocial Support/Ongoing Assessment of Needs Other assessment/ plan:   Information/referral to community  resources:   Clinical Social Worker to provide necessary resources throughout patient hospitalization.  Patient would like to work with therapies prior to further discussing discharge plans.    PATIENT'S/FAMILY'S RESPONSE TO PLAN OF CARE: Patient alert and oriented x3 laying in the bed.  Patient states that she has a friend she could stay with for a week or two if needed at discharge.  Patient seems to have a good support system.  Patient is now retired from her job. Patient understanding of social work role and agreeable with appropriate discharge plan pending therapy evaluations.

## 2013-09-01 NOTE — Evaluation (Signed)
Physical Therapy Evaluation Patient Details Name: Caitlin Cox MRN: [id number] DOB: 1955-11-25 Today's Date: 09/01/2013 Time: 4098-1191 PT Time Calculation (min): 27 min  PT Assessment / Plan / Recommendation History of Present Illness  Patient is a 57 yo female admitted following a fall from roof while cleaning gutters.  Patient with Lt rib (8-11) fx's, small ptx, L2-5 transverse process fx's.  Clinical Impression  Patient presents with problems listed below.  Will benefit from acute PT to maximize independence with mobility prior to discharge home.  Patient does not have 24 hour assist.  Feel she could reach Mod I level to return home with prn assist. (Will need to have someone take care of 2 dogs for a while)    PT Assessment  Patient needs continued PT services    Follow Up Recommendations  Home health PT;Supervision - Intermittent    Does the patient have the potential to tolerate intense rehabilitation      Barriers to Discharge Decreased caregiver support Patient lives alone.  Has friends who can check on patient prn.    Equipment Recommendations  Rolling walker with 5" wheels;3in1 (PT)    Recommendations for Other Services     Frequency Min 6X/week    Precautions / Restrictions Precautions Required Braces or Orthoses: Spinal Brace Spinal Brace: Lumbar corset;Applied in sitting position Restrictions Weight Bearing Restrictions: No   Pertinent Vitals/Pain Pain 9/10 with mobility, limiting session.      Mobility  Bed Mobility Bed Mobility: Rolling Right;Right Sidelying to Sit;Sitting - Scoot to Edge of Bed;Sit to Sidelying Right Rolling Right: 5: Supervision;With rail Right Sidelying to Sit: 4: Min assist;With rails;HOB elevated Sitting - Scoot to Edge of Bed: 4: Min guard;With rail Sit to Sidelying Right: 4: Min assist;HOB flat Details for Bed Mobility Assistance: Verbal cues for technique.  Assist to raise trunk to sitting position.  Once upright, patient  with good posture.  Min assist to lift LE's onto bed to return to sidelying. Transfers Transfers: Sit to Stand;Stand to Sit Sit to Stand: 4: Min assist;With upper extremity assist;From bed;From toilet Stand to Sit: 4: Min guard;With upper extremity assist;To bed;To toilet Details for Transfer Assistance: Verbal cues for hand placement and technique.  Assist to rise to standing.. Able to maintain balance with use of RW.  In sitting, educated patient on how to doff back brace.  Also assisted patient in donning shorts and Tshirt. Ambulation/Gait Ambulation/Gait Assistance: 4: Min guard Ambulation Distance (Feet): 82 Feet Assistive device: Rolling walker Ambulation/Gait Assistance Details: Verbal cues for safe use of RW and gait sequence. Gait Pattern: Step-through pattern;Decreased stride length Gait velocity: Slow gait speed    Exercises     PT Diagnosis: Difficulty walking;Acute pain  PT Problem List: Decreased strength;Decreased range of motion;Decreased activity tolerance;Decreased balance;Decreased mobility;Decreased knowledge of use of DME;Decreased knowledge of precautions;Pain PT Treatment Interventions: DME instruction;Gait training;Stair training;Functional mobility training;Patient/family education     PT Goals(Current goals can be found in the care plan section) Acute Rehab PT Goals Patient Stated Goal: To be able to go home with prn assist. PT Goal Formulation: With patient Time For Goal Achievement: 09/08/13 Potential to Achieve Goals: Good  Visit Information  Last PT Received On: 09/01/13 Assistance Needed: +1 History of Present Illness: Patient is a 57 yo female admitted following a fall from roof while cleaning gutters.  Patient with Lt rib (8-11) fx's, small ptx, L2-5 transverse process fx's.       Prior Functioning  Home Living Family/patient expects to  be discharged to:: Private residence Living Arrangements: Alone Available Help at Discharge:  Friend(s);Available PRN/intermittently Type of Home: House Home Access: Stairs to enter [employer] of Steps: 2 Entrance Stairs-Rails: None Home Layout: Two level;Able to live on main level with bedroom/bathroom (Dogs are in basement - has them boarded now.) Home Equipment: None Prior Function Level of Independence: Independent Comments: Very active pta. Communication Communication: No difficulties    Cognition  Cognition Arousal/Alertness: Awake/alert Behavior During Therapy: WFL for tasks assessed/performed Overall Cognitive Status: Within Functional Limits for tasks assessed    Extremity/Trunk Assessment Upper Extremity Assessment Upper Extremity Assessment: Overall WFL for tasks assessed Lower Extremity Assessment Lower Extremity Assessment: Generalized weakness (Weakness due to pain with trunk movement)   Balance Balance Balance Assessed: Yes Static Sitting Balance Static Sitting - Balance Support: Right upper extremity supported;Feet supported Static Sitting - Level of Assistance: 5: Stand by assistance Static Sitting - Comment/# of Minutes: 2 Static Standing Balance Static Standing - Balance Support: Bilateral upper extremity supported Static Standing - Level of Assistance: 5: Stand by assistance Static Standing - Comment/# of Minutes: 2  End of Session PT - End of Session Equipment Utilized During Treatment: Back brace;Gait belt Activity Tolerance: Patient limited by pain;Patient limited by fatigue Patient left: in bed;with call bell/phone within reach;with family/visitor present Nurse Communication: Mobility status;Patient requests pain meds  GP     Vena Austria 09/01/2013, 7:52 PM Durenda Hurt. Renaldo Fiddler, [facility] Acute Rehab Services Pager [phone]

## 2013-09-01 NOTE — Progress Notes (Signed)
Orthopedic Tech Progress Note Patient Details:  Caitlin Cox 24-Apr-1956 [id number]  Patient ID: Caitlin Cox, female   DOB: 1956-02-23, 57 y.o.   MRN: [id number]   Caitlin Cox 09/01/2013, 9:07 AMCalled bio-tech for TLSO brace.

## 2013-09-02 DIAGNOSIS — E2749 Other adrenocortical insufficiency: Secondary | ICD-10-CM | POA: Diagnosis present

## 2013-09-02 DIAGNOSIS — W132XXA Fall from, out of or through roof, initial encounter: Secondary | ICD-10-CM

## 2013-09-02 MED ORDER — HYDROMORPHONE HCL PF 1 MG/ML IJ SOLN: 0.5000 mg | INTRAMUSCULAR | Status: DC | PRN

## 2013-09-02 MED ORDER — TIZANIDINE HCL 4 MG PO TABS
4.0000 mg | ORAL_TABLET | Freq: Three times a day (TID) | ORAL | Status: DC
  Administered 2013-09-02 – 2013-09-05 (×11): 4 mg via ORAL
  Filled 2013-09-02 (×13): qty 1

## 2013-09-02 MED ORDER — HYDROCODONE-ACETAMINOPHEN 10-325 MG PO TABS
0.5000 | ORAL_TABLET | ORAL | Status: DC | PRN
  Administered 2013-09-02 – 2013-09-05 (×15): 2 via ORAL
  Filled 2013-09-02 (×15): qty 2

## 2013-09-02 MED ORDER — TRAMADOL HCL 50 MG PO TABS
100.0000 mg | ORAL_TABLET | Freq: Four times a day (QID) | ORAL | Status: DC
  Administered 2013-09-02 – 2013-09-05 (×13): 100 mg via ORAL
  Filled 2013-09-02 (×13): qty 2

## 2013-09-02 NOTE — Progress Notes (Signed)
Physical Therapy Treatment Patient Details Name: Caitlin Cox MRN: [id number] DOB: 1956/06/01 Today's Date: 09/02/2013 Time: 7829-5621 PT Time Calculation (min): 41 min  PT Assessment / Plan / Recommendation  History of Present Illness Patient is a 57 yo female admitted following a fall from roof while cleaning gutters.  Patient with Lt rib (8-11) fx's, small ptx, L2-5 transverse process fx's.   PT Comments   Pt progressing with mobility this tx although increased time needed with bed mobility and transfers due to 10/10 pain.    Pt stated that she is not ready to go home today and would like to stay one more night.  Recommended that pt attempt stair training and further bed mobility/transfers without rails and HOB flat prior to d/c home.  Once at home pt will benefit from [facility] to increase functional independence and mobility.  Follow Up Recommendations  Home health PT;Supervision - Intermittent     Does the patient have the potential to tolerate intense rehabilitation     Barriers to Discharge        Equipment Recommendations  Rolling walker with 5" wheels;3in1 (PT)    Recommendations for Other Services    Frequency Min 6X/week   Progress towards PT Goals Progress towards PT goals: Progressing toward goals  Plan Current plan remains appropriate    Precautions / Restrictions Precautions Required Braces or Orthoses: Spinal Brace Spinal Brace: Lumbar corset;Applied in sitting position Restrictions Weight Bearing Restrictions: No   Pertinent Vitals/Pain 10/10 with bed mobility; 6/10 otherwise. Pt repositioned for comfort at end of session.    Mobility  Bed Mobility Bed Mobility: Rolling Right;Right Sidelying to Sit;Sitting - Scoot to Edge of Bed;Sit to Sidelying Right Rolling Right: 5: Supervision Right Sidelying to Sit: HOB flat;4: Min guard Sitting - Scoot to Edge of Bed: 5: Supervision Sit to Sidelying Right: HOB flat;4: Min guard Details for Bed Mobility Assistance:  vc's for technique.  No rails and HOB flat today to simulate home environment. Pt did not require physical assistance however was 10/10 pain in L ribs with all bed mobility Transfers Transfers: Sit to Stand;Stand to Sit Sit to Stand: 4: Min guard;From bed Stand to Sit: To bed;4: Min guard Details for Transfer Assistance: vc's for hand placement; no physical assistance required today. Ambulation/Gait Ambulation Distance (Feet): 150 Feet Assistive device: Rolling walker Ambulation/Gait Assistance Details: vc's for erect posture and to decrease scapular elevation Gait Pattern: Step-through pattern;Decreased stride length Gait velocity: Slow gait speed    Exercises     PT Diagnosis:    PT Problem List:   PT Treatment Interventions:     PT Goals (current goals can now be found in the care plan section)    Visit Information  Last PT Received On: 09/02/13 History of Present Illness: Patient is a 57 yo female admitted following a fall from roof while cleaning gutters.  Patient with Lt rib (8-11) fx's, small ptx, L2-5 transverse process fx's.    Subjective Data      Cognition  Cognition Arousal/Alertness: Awake/alert Behavior During Therapy: WFL for tasks assessed/performed Overall Cognitive Status: Within Functional Limits for tasks assessed    Balance     End of Session PT - End of Session Equipment Utilized During Treatment: Back brace;Gait belt Activity Tolerance: Patient tolerated treatment well;Patient limited by fatigue Patient left: in bed;with call bell/phone within reach Nurse Communication: Mobility status   GP     Ernestina Columbia, SPTA 09/02/2013, 12:10 PM

## 2013-09-02 NOTE — Evaluation (Signed)
Occupational Therapy Evaluation Patient Details Name: Caitlin Cox MRN: [id number] DOB: 06-13-1956 Today's Date: 09/02/2013 Time: 4098-1191 OT Time Calculation (min): 51 min  OT Assessment / Plan / Recommendation History of present illness Patient is a 57 yo female admitted following a fall from roof while cleaning gutters.  Patient with Lt rib (8-11) fx's, small ptx, L2-5 transverse process fx's.   Clinical Impression   PT admitted with s/p fall with L2-5 transverse process fx and LT rib fx. Pt currently with functional limitiations due to the deficits listed below (see OT problem list).  Pt will benefit from skilled OT to increase their independence and safety with adls and balance to allow discharge HHOT and aide. MD PLEASE REVIEW INJURIES WITH PATIENT. Pt reports having poor understanding of injuries due to pain medication.     OT Assessment  Patient needs continued OT Services    Follow Up Recommendations  Home health OT;Supervision - Intermittent;Other (comment) (aide to help with hygiene and adls)    Barriers to Discharge Decreased caregiver support    Equipment Recommendations  Other (comment) (RW)    Recommendations for Other Services    Frequency  Min 2X/week    Precautions / Restrictions Precautions Required Braces or Orthoses: Spinal Brace Spinal Brace: Lumbar corset;Applied in sitting position Restrictions Weight Bearing Restrictions: No   Pertinent Vitals/Pain Rib muscle spasms    ADL  Eating/Feeding: Independent Where Assessed - Eating/Feeding: Chair Upper Body Dressing: Supervision/safety Where Assessed - Upper Body Dressing: Unsupported sitting (doff brace) Lower Body Dressing: Supervision/safety Where Assessed - Lower Body Dressing: Unsupported sitting (able to cross bil LE) Equipment Used: Gait belt;Back brace;Rolling walker Transfers/Ambulation Related to ADLs: pt ambulating with RW Min guard (A)  ADL Comments: Pt educated on back precautions  and adls with back precautions. pt very anxious about d/c home due to must be MOD I to d/c home alone. pt has family friends for PRN only. Pt educated on toilet aid for personal hygiene and plans to purchase. Pt has an elevated bed and needs to practice this prior to d/c home. Pt has x2 dogs and cat to care for and discussed care this session. pt would like detailed explaination of injuries. Pt tearful during session at times    OT Diagnosis: Generalized weakness;Acute pain  OT Problem List: Decreased strength;Decreased activity tolerance;Impaired balance (sitting and/or standing);Decreased safety awareness;Decreased knowledge of use of DME or AE;Decreased knowledge of precautions;Pain OT Treatment Interventions: Self-care/ADL training;Therapeutic exercise;DME and/or AE instruction;Therapeutic activities;Patient/family education;Balance training   OT Goals(Current goals can be found in the care plan section) Acute Rehab OT Goals Patient Stated Goal: To get (A) if my insurance can cover it because its just me OT Goal Formulation: With patient Time For Goal Achievement: 09/16/13 Potential to Achieve Goals: Good  Visit Information  Last OT Received On: 09/02/13 Assistance Needed: +1 History of Present Illness: Patient is a 58 yo female admitted following a fall from roof while cleaning gutters.  Patient with Lt rib (8-11) fx's, small ptx, L2-5 transverse process fx's.       Prior Functioning     Home Living Family/patient expects to be discharged to:: Private residence Living Arrangements: Alone Available Help at Discharge: Friend(s);Available PRN/intermittently Type of Home: House Home Access: Stairs to enter [employer] of Steps: 2 Entrance Stairs-Rails: None Home Layout: Two level;Able to live on main level with bedroom/bathroom (Dogs are in basement - has them boarded now.) Home Equipment: None Prior Function Level of Independence: Independent Comments: Very active  pta.  enjoys sports  Communication Communication: No difficulties Dominant Hand: Right         Vision/Perception     Cognition  Cognition Arousal/Alertness: Awake/alert Behavior During Therapy: WFL for tasks assessed/performed Overall Cognitive Status: Within Functional Limits for tasks assessed    Extremity/Trunk Assessment Upper Extremity Assessment Upper Extremity Assessment: Overall WFL for tasks assessed Lower Extremity Assessment Lower Extremity Assessment: Defer to PT evaluation Cervical / Trunk Assessment Cervical / Trunk Assessment: Normal     Mobility Bed Mobility Bed Mobility: Sit to Supine Sit to Supine: 4: Min guard;HOB flat (educated on sequence) Details for Bed Mobility Assistance: pt needed cues for sequence and able to complete. Pt has elevated bed and need to practice with bed higher and step stool Transfers Transfers: Sit to Stand;Stand to Sit Sit to Stand: With upper extremity assist;From bed;4: Min guard Stand to Sit: 4: Min guard;With upper extremity assist;To bed Details for Transfer Assistance: pt with slight buckle due to pain with sit <>Stand good recall of hand placement     Exercise     Balance Static Standing Balance Static Standing - Balance Support: Bilateral upper extremity supported Static Standing - Level of Assistance: 5: Stand by assistance   End of Session OT - End of Session Activity Tolerance: Patient tolerated treatment well Patient left: in bed;with call bell/phone within reach Nurse Communication: Mobility status;Precautions  GO     Harolyn Rutherford 09/02/2013, 4:45 PM Pager: [phone]

## 2013-09-02 NOTE — Progress Notes (Signed)
Patient ID: Caitlin Cox, female   DOB: 07-27-1956, 57 y.o.   MRN: [id number]   LOS: 2 days   Subjective: Pain not well controlled, having a fair amount of problems with intercostal muscle spasm.   Objective: Vital signs in last 24 hours: Temp:  [98.1 F (36.7 C)-98.7 F (37.1 C)] 98.3 F (36.8 C) (11/25 0558) Pulse Rate:  [74-88] 74 (11/25 0558) Resp:  [18] 18 (11/25 0558) BP: (113-149)/(62-81) 127/74 mmHg (11/25 0558) SpO2:  [92 %-98 %] 97 % (11/25 0748) Last BM Date: 08/31/13   IS: (=)   Physical Exam General appearance: alert and no distress Resp: clear to auscultation bilaterally Cardio: regular rate and rhythm GI: normal findings: bowel sounds normal and soft, non-tender   Assessment/Plan: Fall off roof  L rib FX 8-11 with small PTX - Pulmonary toilet, pain control  Lumbar TVP FXs 2-5 - pain control, LSO for comfort per Dr. Venetia Maxon  L adrenal contusion  FEN - Increase oral pain meds, schedule muscle relaxer VTE - SCD's, Lovenox  Dispo - PT/OT. Will need to be mod I and do stairs before d/c.    Freeman Caldron, PA-C Pager: [phone] General Trauma PA Pager: [phone]   09/02/2013

## 2013-09-02 NOTE — Progress Notes (Signed)
UR completed.  Gilford Lardizabal, RN BSN MHA CCM Trauma/Neuro ICU Case Manager [phone]  

## 2013-09-02 NOTE — Progress Notes (Signed)
Adjusting pain meds. Working with therapies. Patient examined and I agree with the assessment and plan  Violeta Gelinas, MD, MPH, FACS Pager: [phone]  09/02/2013 10:01 AM

## 2013-09-02 NOTE — Progress Notes (Signed)
OT NOTE  MD if agree please place order for "shower" if appropriate. Pt must complete bed mobility with step, stairs with physical therapy and full ADL with OT prior to any discharge.   Mateo Flow   OTR/L Pager: [phone] Office: [phone] .

## 2013-09-02 NOTE — Progress Notes (Signed)
Seen and agreed 09/02/2013 Robinette, Julia Elizabeth PTA [phone] pager [phone] office    

## 2013-09-03 ENCOUNTER — Inpatient Hospital Stay (HOSPITAL_COMMUNITY): Payer: 59

## 2013-09-03 MED ORDER — ALUM & MAG HYDROXIDE-SIMETH 200-200-20 MG/5ML PO SUSP
30.0000 mL | ORAL | Status: DC | PRN
  Administered 2013-09-03 (×2): 30 mL via ORAL
  Filled 2013-09-03 (×2): qty 30

## 2013-09-03 NOTE — Progress Notes (Signed)
Seen and agreed 09/03/2013 Robinette, Julia Elizabeth PTA [phone] pager [phone] office    

## 2013-09-03 NOTE — Progress Notes (Signed)
I have seen and examined the patient and agree with the assessment and plans.  Jordanna Hendrie A. Christapher Gillian  MD, FACS  

## 2013-09-03 NOTE — Discharge Summary (Signed)
Physician Discharge Summary  Patient ID: Caitlin Cox MRN: [id number] DOB/AGE: 07-Dec-1955 57 y.o.  Admit date: 08/31/2013 Discharge date: 09/03/2013  Admitting Diagnosis: Fall from roof Adrenal hemorrhage Multiple left rib fractures Lumbar process fractures Pneumothorax  Discharge Diagnosis Patient Active Problem List   Diagnosis Date Noted  . Fall from roof 09/02/2013  . Adrenal hemorrhage 09/02/2013  . Multiple fractures of ribs of left side 09/01/2013  . Lumbar transverse process fracture 09/01/2013  . Pneumothorax, left 09/01/2013  . Right knee pain 06/25/2013  . Right foot pain 02/10/2013  . Injury, finger 06/07/2012  . Carpal tunnel syndrome on right 03/20/2012  . Leg length discrepancy 01/08/2012  . Achilles tendinitis of left lower extremity 01/08/2012    Consultants Dr. Venetia Maxon (Neurosurgery)  Imaging: Dg Elbow 2 Views Left  09/03/2013   CLINICAL DATA:  Left elbow pain and ecchymosis.  EXAM: LEFT ELBOW - 2 VIEW  COMPARISON:  None.  FINDINGS: Anatomic alignment of the left elbow. There is no fracture or elbow effusion identified. Tiny marginal osteophyte off the radial head. Soft tissues appear within normal limits.  IMPRESSION: No acute osseous abnormality.  Mild osteoarthritis.   Electronically Signed   By: Andreas Newport M.D.   On: 09/03/2013 12:12    Procedures None  Hospital Course:  57 year old female with a history of asthma who presented to [facility] as a non trauma following a fall off her roof this morning. The patient states that while trying to clean the gutters, the latter slipped and she fell approximately 8 feet. She landed on her left side and consequently hit her head as well. She denies LOC. She complained of right sided vision changes characterized as "ocular migraines." This resolves in the ambulance en route to the ED. She complained of back pain. She was in a c-collar. She was given of Fentanyl in the ambulance. She was given dilaudid in  the ED which helped with the pain.   She complained of nausea in addition to the symptoms above. She denies neck pain, paresthesias, radiculopathy, loss of bowel or bladder control. She denies abdominal pain. She denies of anticoagulation. She underwent a XR of chest and pelvis which showed a normal pelvis, acute minimally displaced left sided rib fractures. At CT of chest showed a left 8-11th rib fractures and tiny left side PTX at the apex of the lung. It also showed L2-L5 left transverse process. A CT of abdomen revealed a left adrenal gland density which could represent a hemorrhage or a lesion. She reports her pain as severe. Mostly with turning or taking a deep breath in. She complains of shortness of breath, oxygenating at 95% on 2L. Her vital signs are stable. Laboratory evaluation showed a elevated white count, stable hemoglobin and hematocrit.   Patient was admitted for management of her traumatic injuries as above and pain control.  She had significant pain stemming from her rib fractures.  Neurosurgery (Dr. Venetia Maxon) was consulted and recommended a LSO brace for comfort of her Lumbar TP fractures.  PT/OT were consulted and worked with the patient to gain her confidence back in her mobility.  Diet was advanced as tolerated.  She was found to have significant ecchymosis on her left elbow thus an xray was obtained which showed no acute abnormalities.  On HD #4 the patient was ambulating more, but experienced some dizziness with ambulating and was unable to be tested on stairs.  Thus, she was kept one additional for better pain control and  to ensure she was stable to go home and manage on her own upon discharge.  On HD #6, the patient was voiding well, tolerating diet, ambulating well, pain well controlled, vital signs stable, incisions c/d/i and felt stable for discharge home.  Patient will follow up in our office as needed and knows to call with questions or concerns.  She will f/u with Dr. Venetia Maxon as an  outpatient.  She will go [facility] with [facility] with intermittent supervision recommended.    Follow up PCP 3-6 months for a repeat CT of abdomen to evaluate possible adrenal mass    Medication List         buPROPion 75 MG tablet  Commonly known as:  WELLBUTRIN  Take 150 mg by mouth daily.     clonazePAM 0.5 MG tablet  Commonly known as:  KLONOPIN  Take 0.5 mg by mouth as needed.     EVAMIST 1.53 MG/SPRAY transdermal spray  Generic drug:  estradiol  Apply topically daily.     Fluticasone-Salmeterol 100-50 MCG/DOSE Aepb  Commonly known as:  ADVAIR  Inhale 1 puff into the lungs 2 (two) times daily.     progesterone 200 MG capsule  Commonly known as:  PROMETRIUM  Take 200 mg by mouth daily.     SYNTHROID 150 MCG tablet  Generic drug:  levothyroxine  Take 150 mcg by mouth daily.     valACYclovir 1000 MG tablet  Commonly known as:  VALTREX  Take 1 g by mouth Once daily as needed.     VITAMIN C PO  Take by mouth.             Follow-up Information   Call [facility]. (As needed if symptoms worsen)    Contact information:   [address] [place] [address] [phone]       Signed: Jani Files [facility] [phone]  09/05/13 2:35 PM

## 2013-09-03 NOTE — Progress Notes (Signed)
Physical Therapy Treatment Patient Details Name: Caitlin Cox MRN: [id number] DOB: July 17, 1956 Today's Date: 09/03/2013 Time: 4098-1191 PT Time Calculation (min): 47 min  PT Assessment / Plan / Recommendation  History of Present Illness Patient is a 57 yo female admitted following a fall from roof while cleaning gutters.  Patient with Lt rib (8-11) fx's, small ptx, L2-5 transverse process fx's.   PT Comments   Pt progressing well with ambulation today and transfers. Pain and fatigue limiting her to complete stair training today despite motivation.  Recommended to attempt stair training tomorrow prior to d/c home.  Pt will benefit from HHPT to increase functional independence at home.  Follow Up Recommendations  Home health PT;Supervision - Intermittent     Does the patient have the potential to tolerate intense rehabilitation     Barriers to Discharge        Equipment Recommendations  Rolling walker with 5" wheels;3in1 (PT)    Recommendations for Other Services    Frequency Min 6X/week   Progress towards PT Goals Progress towards PT goals: Progressing toward goals  Plan Current plan remains appropriate    Precautions / Restrictions Precautions Required Braces or Orthoses: Spinal Brace Spinal Brace: Lumbar corset;Applied in sitting position Restrictions Weight Bearing Restrictions: No   Pertinent Vitals/Pain 8/10 rib pain; nursing aware    Mobility  Bed Mobility Bed Mobility: Not assessed Details for Bed Mobility Assistance: Pt received standing in room and wanted to return to recliner post tx Transfers Transfers: Sit to Stand;Stand to Sit Sit to Stand: 5: Supervision;From chair/3-in-1;With upper extremity assist Stand to Sit: 5: Supervision;With upper extremity assist;To chair/3-in-1 Details for Transfer Assistance: pt demonstrated good hand placement and safe technique.  Ambulation/Gait Ambulation/Gait Assistance: 5: Supervision;4: Min guard Ambulation Distance  (Feet): 400 Feet Assistive device: Rolling walker Ambulation/Gait Assistance Details: Pt reported mild dizziness with ambulation but declined rest break as pt reported it was due to not having breakfast. vc's to decrease scapular elevation, less weight through UE's and for erect posture Gait Pattern: Step-through pattern;Decreased stride length Gait velocity: Slow gait speed Stairs: No (pt declined today)    Exercises     PT Diagnosis:    PT Problem List:   PT Treatment Interventions:     PT Goals (current goals can now be found in the care plan section)    Visit Information  Last PT Received On: 09/03/13 Assistance Needed: +1 History of Present Illness: Patient is a 57 yo female admitted following a fall from roof while cleaning gutters.  Patient with Lt rib (8-11) fx's, small ptx, L2-5 transverse process fx's.    Subjective Data      Cognition  Cognition Arousal/Alertness: Awake/alert Behavior During Therapy: WFL for tasks assessed/performed Overall Cognitive Status: Within Functional Limits for tasks assessed    Balance     End of Session PT - End of Session Equipment Utilized During Treatment: Back brace;Gait belt Activity Tolerance: Patient limited by fatigue;Patient tolerated treatment well; Pt limited by pain Patient left: in bed;with call bell/phone within reach Nurse Communication: Mobility status   GP     Ernestina Columbia, SPTA 09/03/2013, 10:31 AM

## 2013-09-03 NOTE — Progress Notes (Signed)
Pt c/o severe acid reflux, md on call notified and new order received for maalox.

## 2013-09-03 NOTE — Progress Notes (Signed)
Occupational Therapy Treatment Patient Details Name: Caitlin Cox MRN: [id number] DOB: 1956-01-25 Today's Date: 09/03/2013 Time: 4098-1191 OT Time Calculation (min): 48 min  OT Assessment / Plan / Recommendation  History of present illness Patient is a 57 yo female admitted following a fall from roof while cleaning gutters.  Patient with Lt rib (8-11) fx's, small ptx, L2-5 transverse process fx's.   OT comments  Pt completed all areas of required OT education for safe d/c home at this time. Pt very anxious about d/c home due to decr caregiver support. Pt reports feeling more confident after this session. Pt must complete stairs with PT prior to d/c home. Pt does require continued OT at this time due to decr activity tolerance and education on how to manage dogs upon d/c. Pt at this time plans to keep dogs kenneled at a community based location until able to manage. Dogs are covered and being cared for at this time.   Follow Up Recommendations  Home health OT;Supervision - Intermittent;Other (comment)    Barriers to Discharge       Equipment Recommendations  Other (comment) (Rw aide)    Recommendations for Other Services    Frequency Min 2X/week   Progress towards OT Goals Progress towards OT goals: Progressing toward goals  Plan Discharge plan remains appropriate    Precautions / Restrictions Precautions Precautions: Back Required Braces or Orthoses: Spinal Brace Spinal Brace: Lumbar corset;Applied in sitting position Restrictions Weight Bearing Restrictions: No   Pertinent Vitals/Pain Pain is being managed and premedicated    ADL  Eating/Feeding: Independent Where Assessed - Eating/Feeding: Chair Grooming: Wash/dry hands;Wash/dry face;Teeth care;Supervision/safety Where Assessed - Grooming: Unsupported standing Upper Body Bathing: Chest;Right arm;Left arm;Abdomen;Supervision/safety Where Assessed - Upper Body Bathing: Unsupported sit to stand Lower Body Bathing:  Supervision/safety Where Assessed - Lower Body Bathing: Unsupported sitting Upper Body Dressing: Modified independent Where Assessed - Upper Body Dressing: Supported sitting Lower Body Dressing: Supervision/safety Where Assessed - Lower Body Dressing: Unsupported sit to stand Toilet Transfer: Modified independent Toilet Transfer Method: Sit to Barista: Regular height toilet;Grab bars Toileting - Clothing Manipulation and Hygiene: Modified independent Where Assessed - Engineer, mining and Hygiene: Sit to stand from 3-in-1 or toilet Equipment Used: Back brace;Rolling walker Transfers/Ambulation Related to ADLs: Pt completed room mobility with RW MOD I ADL Comments: Pt educated and completed bed transfer using a step. pt without (A) needed. Pt educated on nightstand and to avoid twisting. pt completed full shower standing for 90 % of the shower. Pt reports feeling much better after showering. Pt complete toilet transfer without deficits. All required OT education acutely has been completed for a safe d/c home.     OT Diagnosis:    OT Problem List:   OT Treatment Interventions:     OT Goals(current goals can now be found in the care plan section) Acute Rehab OT Goals Patient Stated Goal: To get (A) if my insurance can cover it because its just me OT Goal Formulation: With patient Time For Goal Achievement: 09/16/13 Potential to Achieve Goals: Good ADL Goals Pt Will Perform Upper Body Bathing: with modified independence;standing Pt Will Perform Lower Body Bathing: with modified independence;sit to/from stand Pt Will Perform Tub/Shower Transfer: Tub transfer;ambulating Additional ADL Goal #1: Pt will complete bed mobility using a step stool and bed at elevated height MOD I  Visit Information  Last OT Received On: 09/03/13 Assistance Needed: +1 History of Present Illness: Patient is a 57 yo female admitted following  a fall from roof while cleaning  gutters.  Patient with Lt rib (8-11) fx's, small ptx, L2-5 transverse process fx's.    Subjective Data      Prior Functioning       Cognition  Cognition Arousal/Alertness: Awake/alert Behavior During Therapy: WFL for tasks assessed/performed Overall Cognitive Status: Within Functional Limits for tasks assessed    Mobility  Bed Mobility Bed Mobility: Sitting - Scoot to Edge of Bed;Supine to Sit;Sit to Supine Rolling Right: 5: Supervision Right Sidelying to Sit: 5: Supervision Supine to Sit: 5: Supervision Sitting - Scoot to Edge of Bed: 5: Supervision Sit to Supine: 5: Supervision Details for Bed Mobility Assistance: Pt received standing in room and wanted to return to recliner post tx Transfers Transfers: Sit to Stand;Stand to Sit Sit to Stand: 6: Modified independent (Device/Increase time);With upper extremity assist;From bed Stand to Sit: 6: Modified independent (Device/Increase time);With upper extremity assist;To chair/3-in-1 Details for Transfer Assistance: pt demonstrated good hand placement and safe technique.     Exercises      Balance     End of Session OT - End of Session Activity Tolerance: Patient tolerated treatment well Patient left: in chair;with call bell/phone within reach Nurse Communication: Mobility status;Precautions  GO     Harolyn Rutherford 09/03/2013, 11:08 AM Pager: [phone]

## 2013-09-03 NOTE — Clinical Social Work Note (Signed)
Clinical Social Worker continuing to follow patient for support and discharge planning needs.  CSW spoke with patient at bedside who confirms that she plans to return home with home health and intermittent visitors to provide supervision.  Patient has made arrangements with boarder for two dogs during recovery process.  Patient states that a friend plans to provide transportation at discharge.  PT/OT recommending home with home health.  Spoke with RN Case Manager who will follow up with patient to discuss home health needs.    Clinical Social Worker will sign off for now as social work intervention is no longer needed. Please consult [place] again if new need arises.  Macario Golds, [place] [phone]

## 2013-09-03 NOTE — Progress Notes (Signed)
LOS: 3 days   Subjective: Pt emotional about accident.  Pain controlled with orals, but very stiff and sore this am.  Not confident about ambulating and mobilization.  Discussed at length the injuries, treatments, and management.  Physical therapy evaluated the patient and noted her having multiple episodes of lightheadedness.  She could not participate in stairs today due to lightheadedness.  Feels uncomfortable about being able to care for herself.    Objective: Vital signs in last 24 hours: Temp:  [97.5 F (36.4 C)-98.5 F (36.9 C)] 98.3 F (36.8 C) (11/26 0529) Pulse Rate:  [78-90] 87 (11/26 0529) Resp:  [15-20] 15 (11/26 0529) BP: (111-163)/(40-88) 140/70 mmHg (11/26 0529) SpO2:  [96 %-100 %] 98 % (11/26 0529) Last BM Date: 08/31/13  Lab Results:  CBC  Recent Labs  08/31/13 1103 08/31/13 1140 09/01/13 0538  WBC 17.7*  --  9.9  HGB 15.3* 16.3* 12.2  HCT 43.2 48.0* 35.9*  PLT 276  --  202   BMET  Recent Labs  08/31/13 1140 09/01/13 0538  NA 136 134*  K 4.7 4.0  CL 101 100  CO2  --  25  GLUCOSE 201* 106*  BUN 17 12  CREATININE 1.10 0.98  CALCIUM  --  8.5    Imaging: No results found.   PE: General: pleasant, WD/WN white female who is laying in bed in NAD Heart: regular, rate, and rhythm.  Normal s1,s2. No obvious murmurs, gallops, or rubs noted.  Lungs: CTAB, no wheezes, rhonchi, or rales noted.  Respiratory effort non-labored, left sided chest wall tenderness Abd: soft, NT/ND, +BS, no masses, hernias, or organomegaly MS: all 4 extremities are symmetrical with no cyanosis, clubbing, or edema.  LSO brace in place.  Large area of ecchymosis on left elbow/arm Psych: A&Ox3 with an appropriate affect.   Assessment/Plan: Fall off roof  L rib FX 8-11 with small PTX - Pulmonary toilet, pain control  Lumbar TVP FXs 2-5 - pain control, LSO for comfort per Dr. Venetia Maxon  L adrenal contusion  Left elbow ecchymosis and pain - obtain xray FEN - Cont oral pain meds,  schedule muscle relaxer  VTE - SCD's, Lovenox  Dispo - PT/OT. Will need to be mod I and do stairs before d/c.  Got lightheaded and could not participate in stairs.  Will be ready for d/c tomorrow am.  Physical therapy to work with pt early tomorrow AM.  Will get discharge paperwork ready for AM discharge.   Aris Georgia, PA-C Pager: [phone] General Trauma PA Pager: [phone]   09/03/2013

## 2013-09-04 MED ORDER — POLYETHYLENE GLYCOL 3350 17 G PO PACK
34.0000 g | PACK | Freq: Two times a day (BID) | ORAL | Status: AC
  Administered 2013-09-04 (×2): 34 g via ORAL
  Filled 2013-09-04 (×3): qty 2

## 2013-09-04 NOTE — Progress Notes (Signed)
  Subjective: Stable and alert. She states she is still having too much left chest wall pain to go home. She lives alone and has no one to be in the house with her.  Tolerating diet but no bowel movement for 5 days since the accident.  Objective: Vital signs in last 24 hours: Temp:  [98 F (36.7 C)-98.6 F (37 C)] 98 F (36.7 C) (11/27 0527) Pulse Rate:  [67-87] 67 (11/27 0527) Resp:  [18-20] 18 (11/27 0527) BP: (129-155)/(66-74) 155/74 mmHg (11/27 0527) SpO2:  [97 %-99 %] 99 % (11/27 0527) Last BM Date: 08/31/13  Intake/Output from previous day: 11/26 0701 - 11/27 0700 In: 250 [P.O.:250] Out: -  Intake/Output this shift:    General appearance: alert. Cooperative. Minimal distress. Sitting up in chair. Resp: clear to auscultation bilaterally GI: soft, non-tender; bowel sounds normal; no masses,  no organomegaly  Lab Results:  No results found for this basename: WBC, HGB, HCT, PLT,  in the last 72 hours BMET No results found for this basename: NA, K, CL, CO2, GLUCOSE, BUN, CREATININE, CALCIUM,  in the last 72 hours PT/INR No results found for this basename: LABPROT, INR,  in the last 72 hours ABG No results found for this basename: PHART, PCO2, PO2, HCO3,  in the last 72 hours  Studies/Results: Dg Elbow 2 Views Left  09/03/2013   CLINICAL DATA:  Left elbow pain and ecchymosis.  EXAM: LEFT ELBOW - 2 VIEW  COMPARISON:  None.  FINDINGS: Anatomic alignment of the left elbow. There is no fracture or elbow effusion identified. Tiny marginal osteophyte off the radial head. Soft tissues appear within normal limits.  IMPRESSION: No acute osseous abnormality.  Mild osteoarthritis.   Electronically Signed   By: Andreas Newport M.D.   On: 09/03/2013 12:12    Anti-infectives: Anti-infectives   None      Assessment/Plan:  Fall off roof  L rib FX 8-11 with small PTX - Pulmonary toilet, pain control  Lumbar TVP FXs 2-5 - pain control, LSO for comfort per Dr. Venetia Maxon  L adrenal  contusion  FEN - Increase oral pain meds, schedule muscle relaxer  VTE - SCD's, Lovenox   Dispo - PT/OT. Will need to be mod I and do stairs before d/c Possible discharge tomorrow.  Constipation - will give double dose of MiraLAX twice a day today   LOS: 4 days    Lukasz Rogus M. Derrell Lolling, M.D., [facility] General and Minimally invasive Surgery Breast and Colorectal Surgery Trauma Office:   [phone] Pager:   [phone]  09/04/2013

## 2013-09-04 NOTE — Progress Notes (Signed)
09/04/2013 1400 NCM spoke to pt and offered choice. Pt requested [facility] for HH. Has RW and shower bench at home. States no DME is needed. Notified [facility] for HH. Isidoro Donning RN CCM Case Mgmt phone [phone]

## 2013-09-04 NOTE — Progress Notes (Signed)
PT Cancellation Note  Patient Details Name: Caitlin Cox MRN: [id number] DOB: November 18, 1955   Cancelled Treatment:     PT session cancelled per pt's request.  States she just got back into bed, she wants to rest,  & that pain has been "off the charts".    Will f/u tomorrow.      Verdell Face, [place] [phone] 09/04/2013

## 2013-09-05 MED ORDER — TRAMADOL HCL 50 MG PO TABS: 100.0000 mg | ORAL_TABLET | Freq: Four times a day (QID) | ORAL | Status: AC

## 2013-09-05 MED ORDER — POLYETHYLENE GLYCOL 3350 17 G PO PACK: 34.0000 g | PACK | Freq: Three times a day (TID) | ORAL | Status: AC

## 2013-09-05 MED ORDER — DSS 100 MG PO CAPS: 100.0000 mg | ORAL_CAPSULE | Freq: Two times a day (BID) | ORAL | Status: AC

## 2013-09-05 MED ORDER — HYDROCODONE-ACETAMINOPHEN 10-325 MG PO TABS: 0.5000 | ORAL_TABLET | ORAL | Status: DC | PRN

## 2013-09-05 MED ORDER — TIZANIDINE HCL 4 MG PO TABS: 4.0000 mg | ORAL_TABLET | Freq: Three times a day (TID) | ORAL | Status: AC

## 2013-09-05 MED ORDER — POLYETHYLENE GLYCOL 3350 17 G PO PACK
34.0000 g | PACK | Freq: Three times a day (TID) | ORAL | Status: DC
  Administered 2013-09-05 (×2): 34 g via ORAL
  Filled 2013-09-05 (×2): qty 2

## 2013-09-05 NOTE — Progress Notes (Signed)
DC instructions gone over with patient, questions answered, verbalized instructions.  Rx given to patient.  Patient transported via wheelchair to front of hospital accompanied by RN and NT to be taken home by brother.  Home health representative spoke with patient prior to discharge.  Patient in good condition at time of discharge from [facility].

## 2013-09-05 NOTE — Progress Notes (Signed)
  Subjective: Alert and stable. Tolerating diet. States she has not had a bowel movement despite high dose MiraLAX.  Patient states "I cannot go home today because I cannot get up the stairs". She is working with PT and OT. She states she has not attempted stairclimbing yet.  Objective: Vital signs in last 24 hours: Temp:  [97.4 F (36.3 C)-98.7 F (37.1 C)] 97.4 F (36.3 C) (11/28 0505) Pulse Rate:  [67-86] 67 (11/28 0505) Resp:  [16-20] 20 (11/28 0505) BP: (110-150)/(57-80) 149/80 mmHg (11/28 0505) SpO2:  [98 %-100 %] 100 % (11/28 0505) Last BM Date: 08/31/13  Intake/Output from previous day: 11/27 0701 - 11/28 0700 In: 1800 [P.O.:1800] Out: -  Intake/Output this shift:     EXAM: General appearance: alert. Cooperative. Minimal distress. Sitting up in chair.  Resp: clear to auscultation bilaterally, including bases GI: soft, non-tender; bowel sounds normal; no masses, no organomegaly   Lab Results:  No results found for this basename: WBC, HGB, HCT, PLT,  in the last 72 hours BMET No results found for this basename: NA, K, CL, CO2, GLUCOSE, BUN, CREATININE, CALCIUM,  in the last 72 hours PT/INR No results found for this basename: LABPROT, INR,  in the last 72 hours ABG No results found for this basename: PHART, PCO2, PO2, HCO3,  in the last 72 hours  Studies/Results: Dg Elbow 2 Views Left  09/03/2013   CLINICAL DATA:  Left elbow pain and ecchymosis.  EXAM: LEFT ELBOW - 2 VIEW  COMPARISON:  None.  FINDINGS: Anatomic alignment of the left elbow. There is no fracture or elbow effusion identified. Tiny marginal osteophyte off the radial head. Soft tissues appear within normal limits.  IMPRESSION: No acute osseous abnormality.  Mild osteoarthritis.   Electronically Signed   By: Andreas Newport M.D.   On: 09/03/2013 12:12    Anti-infectives: Anti-infectives   None      Assessment/Plan:  Fall off roof   L rib FX 8-11 with small PTX - Pulmonary toilet, pain control    Lumbar TVP FXs 2-5 - pain control, LSO for comfort per Dr. Venetia Maxon   L adrenal contusion   FEN - Increase oral pain meds, schedule muscle relaxer  VTE - SCD's, Lovenox  Dispo - PT/OT  I have reviewed PT and 18 minutes. I have requested case management to arrange HHOT and     HHPT..  Constipation - will give double dose of MiraLAX TID today     LOS: 5 days    Madiline Saffran M. Derrell Lolling, M.D., [facility] General and Minimally invasive Surgery Breast and Colorectal Surgery Trauma Office:   [phone]   09/05/2013

## 2013-09-05 NOTE — Progress Notes (Signed)
Physical Therapy Treatment Patient Details Name: RODERICA CATHELL MRN: [id number] DOB: 1956/05/13 Today's Date: 09/05/2013 Time: 7846-9629 PT Time Calculation (min): 23 min  PT Assessment / Plan / Recommendation  History of Present Illness Patient is a 57 yo female admitted following a fall from roof while cleaning gutters.  Patient with Lt rib (8-11) fx's, small ptx, L2-5 transverse process fx's.   PT Comments   Pt moving well and performed stairs today.  Pt needs encouragement, but anticipate good progress at home.  Pt ready for D/C from PT stand point.    Follow Up Recommendations  Home health PT;Supervision - Intermittent     Does the patient have the potential to tolerate intense rehabilitation     Barriers to Discharge        Equipment Recommendations  Rolling walker with 5" wheels;3in1 (PT)    Recommendations for Other Services    Frequency Min 6X/week   Progress towards PT Goals Progress towards PT goals: Progressing toward goals  Plan Current plan remains appropriate    Precautions / Restrictions Precautions Precautions: Back Required Braces or Orthoses: Spinal Brace Spinal Brace: Lumbar corset;Applied in sitting position Restrictions Weight Bearing Restrictions: No   Pertinent Vitals/Pain Ribs "Better, but still grab sometimes."      Mobility  Bed Mobility Bed Mobility: Not assessed Transfers Transfers: Sit to Stand;Stand to Sit Sit to Stand: 6: Modified independent (Device/Increase time);With upper extremity assist;From chair/3-in-1;From bed Stand to Sit: 6: Modified independent (Device/Increase time);With upper extremity assist;To bed;To chair/3-in-1 Ambulation/Gait Ambulation/Gait Assistance: 5: Supervision Ambulation Distance (Feet): 400 Feet Assistive device: Rolling walker Ambulation/Gait Assistance Details: cues for back precautions with conversation while ambulating.   Gait Pattern: Step-through pattern;Decreased stride length Stairs: Yes Stairs  Assistance: 4: Min assist Stairs Assistance Details (indicate cue type and reason): cues for sequencing and encouragement.   Stair Management Technique: One rail Right;Forwards (pt used R rail to simulate her walking pole.  ) Number of Stairs: 2 Wheelchair Mobility Wheelchair Mobility: No    Exercises     PT Diagnosis:    PT Problem List:   PT Treatment Interventions:     PT Goals (current goals can now be found in the care plan section) Acute Rehab PT Goals Time For Goal Achievement: 09/08/13 Potential to Achieve Goals: Good  Visit Information  Last PT Received On: 09/05/13 Assistance Needed: +1 History of Present Illness: Patient is a 56 yo female admitted following a fall from roof while cleaning gutters.  Patient with Lt rib (8-11) fx's, small ptx, L2-5 transverse process fx's.    Subjective Data  Subjective: "I didn't tell the doctor yesterday, that I only have 2 stairs."   Cognition  Cognition Arousal/Alertness: Awake/alert Behavior During Therapy: WFL for tasks assessed/performed Overall Cognitive Status: Within Functional Limits for tasks assessed    Balance  Balance Balance Assessed: No  End of Session PT - End of Session Equipment Utilized During Treatment: Gait belt;Back brace Activity Tolerance: Patient tolerated treatment well Patient left: in chair;with call bell/phone within reach Nurse Communication: Mobility status   GP     Sunny Schlein, [place] [phone] 09/05/2013, 12:07 PM

## 2013-09-09 ENCOUNTER — Telehealth (HOSPITAL_COMMUNITY): Payer: Self-pay | Admitting: Emergency Medicine

## 2013-09-10 NOTE — Telephone Encounter (Signed)
Received multiple calls in last 24h for refill of pain medicine. By our records she received a rx for #80 which should have lasted her to Friday. She insisted she was only dispensed #60. After agreeing to refill medication I called pharmacy who confirmed dispensing #80. I called the patient back who said she realized her brother had moved some of the medication to a pill box and she hadn't realized it. I agreed to give her a refill on Friday morning and it would be available for pickup then. Product and number to be dependent on conversation with supervising MD.

## 2013-09-11 ENCOUNTER — Other Ambulatory Visit (INDEPENDENT_AMBULATORY_CARE_PROVIDER_SITE_OTHER): Payer: Self-pay | Admitting: Orthopedic Surgery

## 2013-09-11 MED ORDER — HYDROCODONE-ACETAMINOPHEN 10-325 MG PO TABS: 0.5000 | ORAL_TABLET | ORAL | Status: AC | PRN

## 2013-12-23 ENCOUNTER — Other Ambulatory Visit: Payer: Self-pay | Admitting: Family Medicine

## 2013-12-23 DIAGNOSIS — E278 Other specified disorders of adrenal gland: Secondary | ICD-10-CM

## 2014-01-06 ENCOUNTER — Ambulatory Visit
Admission: RE | Admit: 2014-01-06 | Discharge: 2014-01-06 | Disposition: A | Discharge status: Home / Self Care | Payer: 59 | Source: Ambulatory Visit | Attending: Family Medicine | Admitting: Family Medicine

## 2014-01-06 DIAGNOSIS — E278 Other specified disorders of adrenal gland: Secondary | ICD-10-CM

## 2014-01-21 ENCOUNTER — Other Ambulatory Visit: Payer: Self-pay

## 2014-01-21 DIAGNOSIS — Z1231 Encounter for screening mammogram for malignant neoplasm of breast: Secondary | ICD-10-CM

## 2014-02-05 ENCOUNTER — Encounter (INDEPENDENT_AMBULATORY_CARE_PROVIDER_SITE_OTHER): Payer: Self-pay

## 2014-02-05 ENCOUNTER — Ambulatory Visit
Admission: RE | Admit: 2014-02-05 | Discharge: 2014-02-05 | Disposition: A | Discharge status: Home / Self Care | Payer: 59 | Source: Ambulatory Visit

## 2014-02-05 DIAGNOSIS — Z1231 Encounter for screening mammogram for malignant neoplasm of breast: Secondary | ICD-10-CM

## 2015-10-13 IMAGING — CT CT ABDOMEN W/O CM
2 of 4 series · 11 of 36 positions shown, 18 images · IV contrast (READICAT/WATER)
Comparison: CT abdomen pelvis dated 08/31/2013

CLINICAL DATA: Follow-up left adrenal mass on CT

EXAM:
CT ABDOMEN WITHOUT CONTRAST
TECHNIQUE: Multidetector CT imaging of the abdomen was performed following the
standard protocol without IV contrast.

[Series 3: adrenal without · axial · non-contrast · 0.79mm/px · z∈[-272,-47]mm · 10 of 110 slices shown, 16 images]
[im 10/110  soft-tissue]
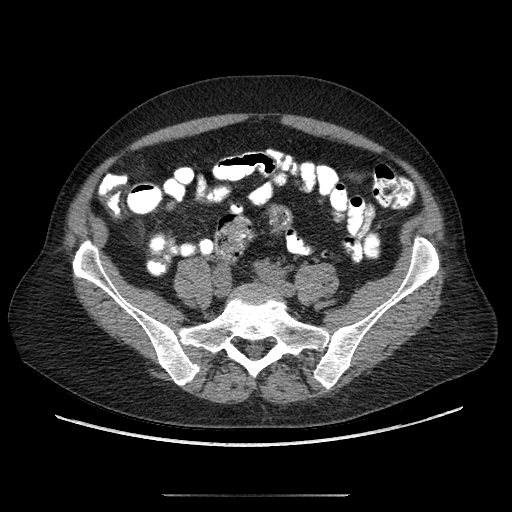
[im 10/110  bone]
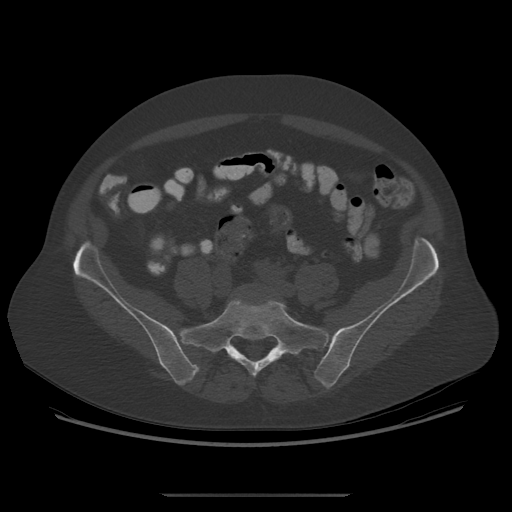
[im 20/110  soft-tissue]
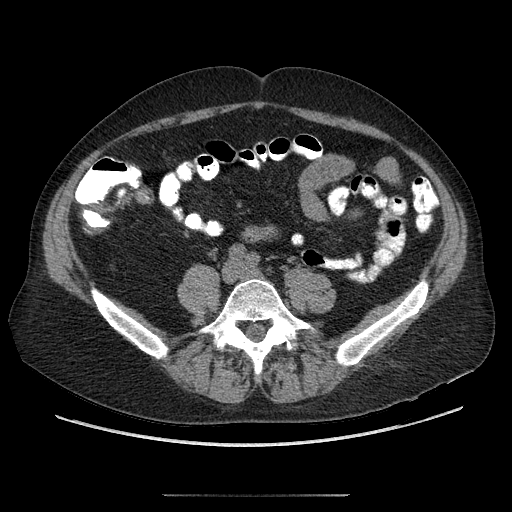
[im 30/110  soft-tissue]
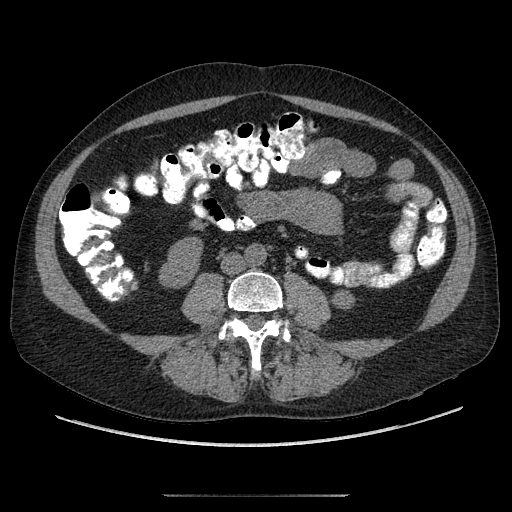
[im 40/110  soft-tissue]
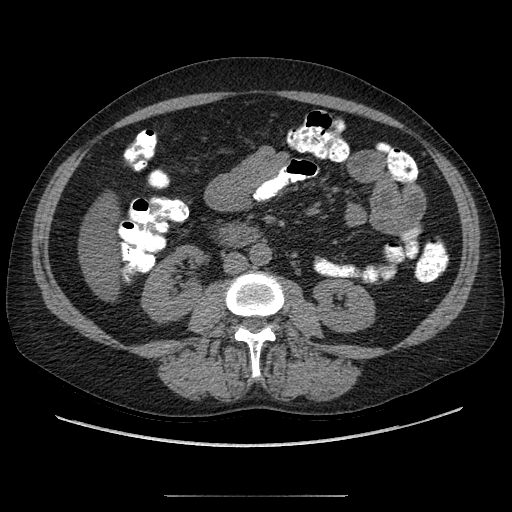
[im 50/110  soft-tissue]
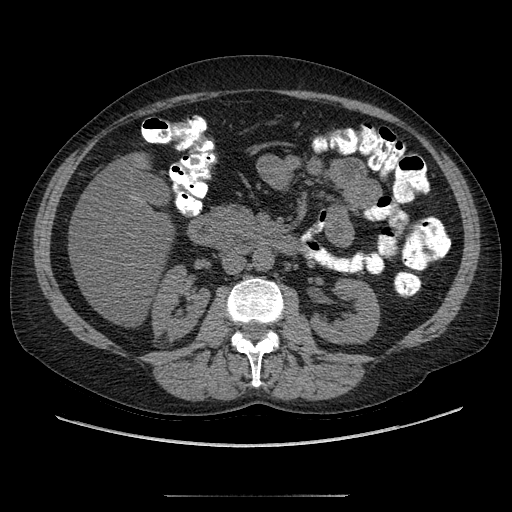
[im 60/110  soft-tissue]
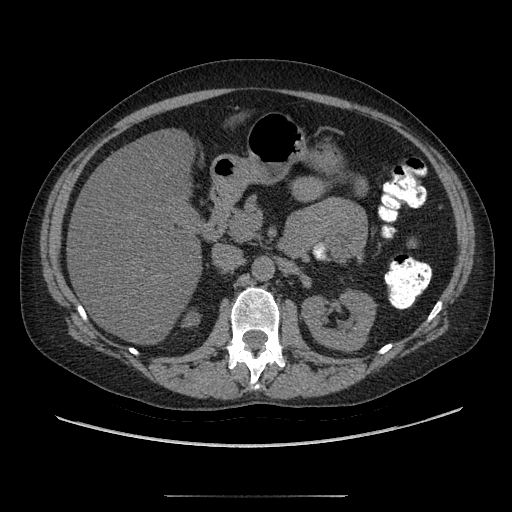
[im 70/110  soft-tissue]
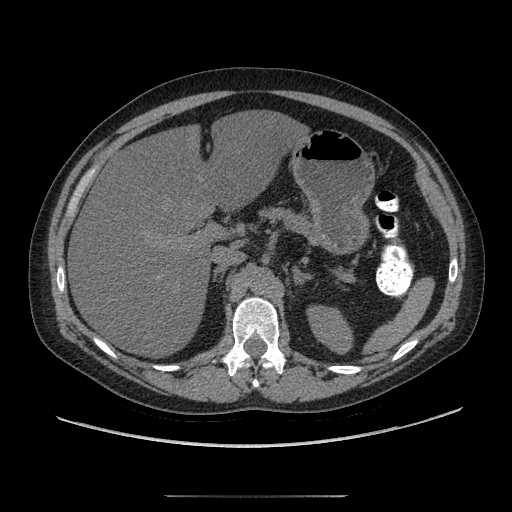
[im 70/110  lung]
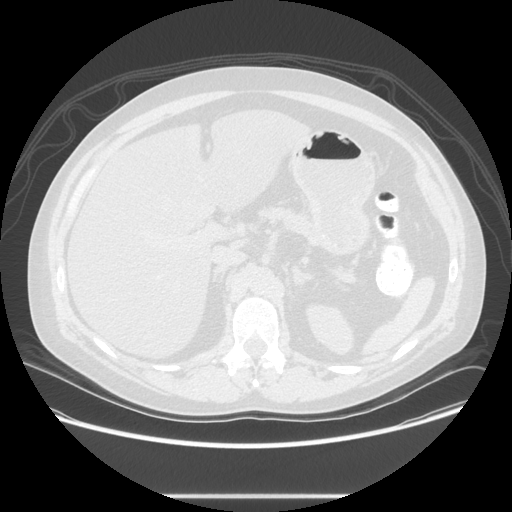
[im 80/110  soft-tissue]
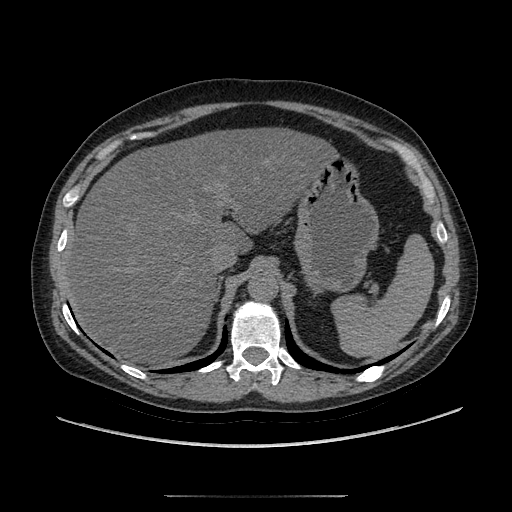
[im 80/110  lung]
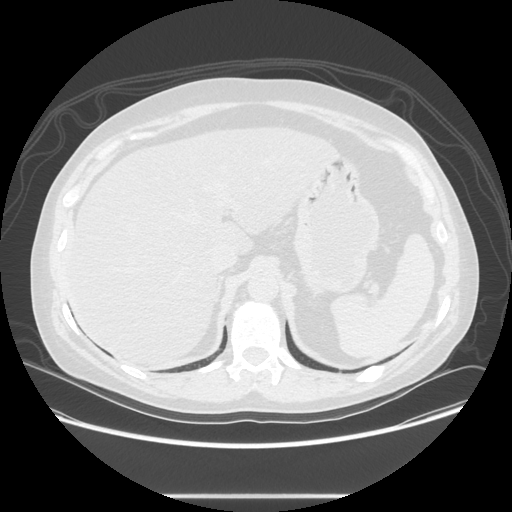
[im 90/110  soft-tissue]
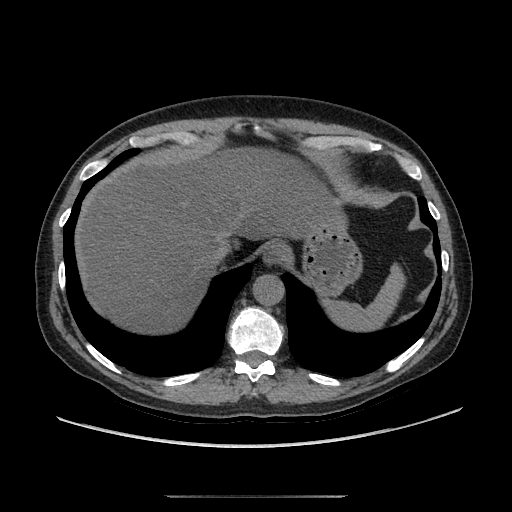
[im 90/110  lung]
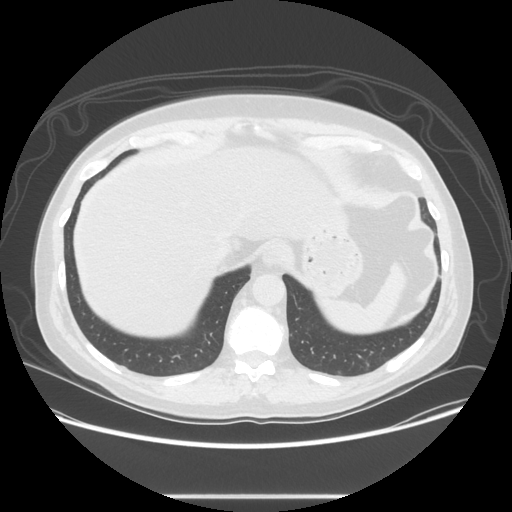
[im 90/110  bone]
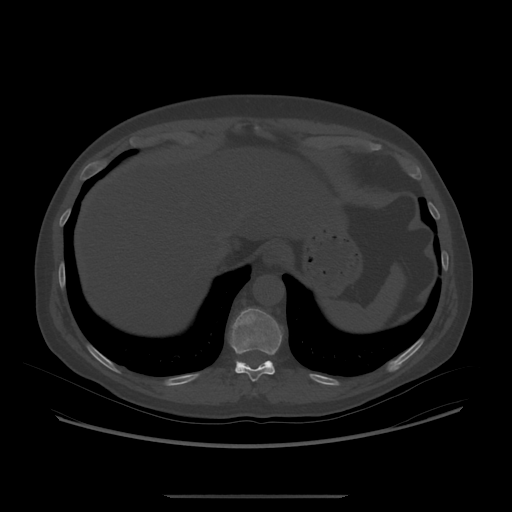
[im 100/110  soft-tissue]
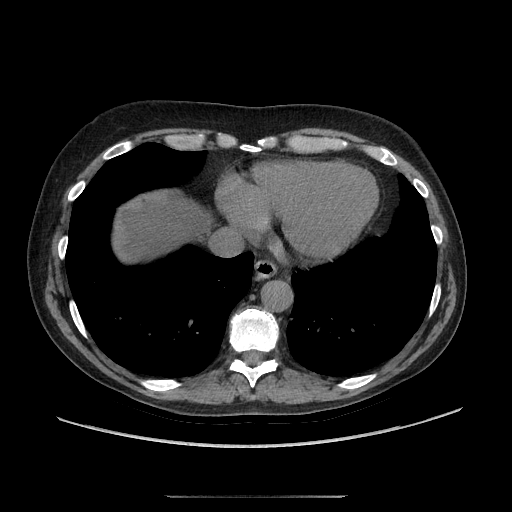
[im 100/110  lung]
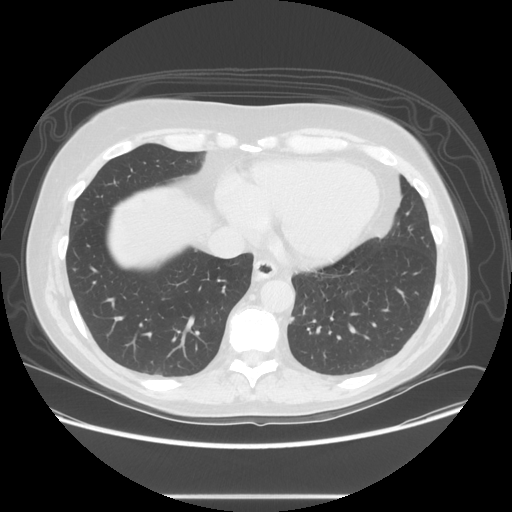

[Series 601: coronal body · coronal · 0.79mm/px · 1 of 121 slices shown, 2 images]
[im 41/121  soft-tissue]
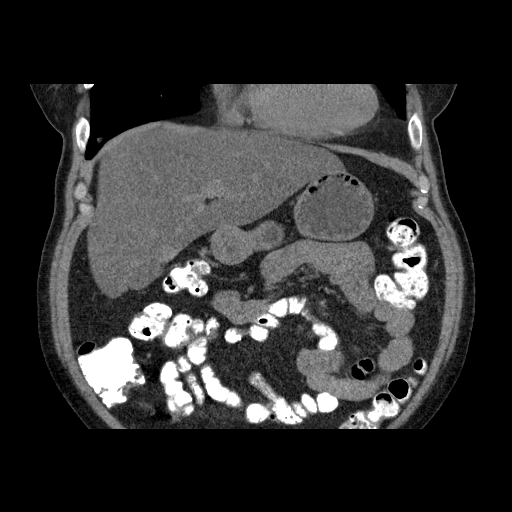
[im 41/121  bone]
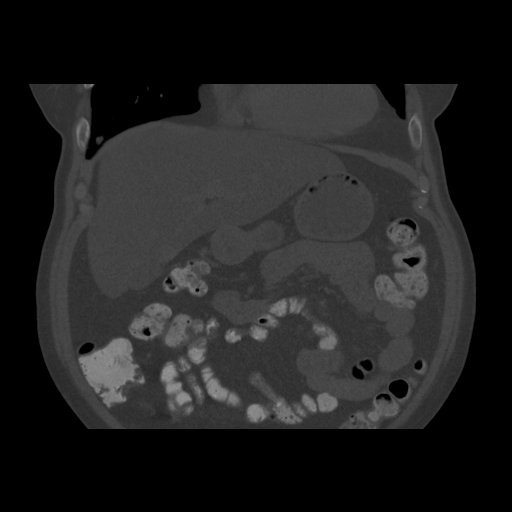

[11 of 36 positions shown; findings below may reference images not displayed]

FINDINGS: Lung bases are clear.

Moderate to severe hepatic steatosis.

Unenhanced spleen and pancreas are within normal limits.

Bilateral adrenal glands are within normal limits. Specifically, the
prior left adrenal mass has resolved, and likely reflected adrenal
hemorrhage.

Gallbladder is notable for possible layering sludge (series 3/image
60). No intrahepatic or extrahepatic ductal dilatation.

1.6 cm right posterior interpolar renal angiomyelolipoma (series
3/image 62). Left kidney is within normal limits. No hydronephrosis.

No abdominal ascites.

No suspicious abdominal lymphadenopathy.

No evidence of abdominal aortic aneurysm.

Mild degenerative changes of the visualized thoracolumbar spine.

Healing left posterolateral 8th-22th rib fractures.
IMPRESSION: Prior left adrenal mass (likely adrenal hemorrhage) has resolved.

Moderate to severe hepatic steatosis.

1.6 cm right renal angiomyelolipoma.

Healing left posterolateral 8th-22th rib fractures.

## 2016-11-16 DIAGNOSIS — E039 Hypothyroidism, unspecified: Secondary | ICD-10-CM | POA: Diagnosis not present

## 2016-11-28 DIAGNOSIS — L570 Actinic keratosis: Secondary | ICD-10-CM | POA: Diagnosis not present

## 2016-11-28 DIAGNOSIS — Z1283 Encounter for screening for malignant neoplasm of skin: Secondary | ICD-10-CM | POA: Diagnosis not present

## 2016-11-28 DIAGNOSIS — L910 Hypertrophic scar: Secondary | ICD-10-CM | POA: Diagnosis not present

## 2016-11-28 DIAGNOSIS — D225 Melanocytic nevi of trunk: Secondary | ICD-10-CM | POA: Diagnosis not present

## 2016-12-20 DIAGNOSIS — H8110 Benign paroxysmal vertigo, unspecified ear: Secondary | ICD-10-CM | POA: Diagnosis not present

## 2016-12-20 DIAGNOSIS — R42 Dizziness and giddiness: Secondary | ICD-10-CM | POA: Diagnosis not present

## 2017-01-08 DIAGNOSIS — H10413 Chronic giant papillary conjunctivitis, bilateral: Secondary | ICD-10-CM | POA: Diagnosis not present

## 2017-01-08 DIAGNOSIS — H11153 Pinguecula, bilateral: Secondary | ICD-10-CM | POA: Diagnosis not present

## 2017-01-08 DIAGNOSIS — H40013 Open angle with borderline findings, low risk, bilateral: Secondary | ICD-10-CM | POA: Diagnosis not present

## 2017-01-25 DIAGNOSIS — R7303 Prediabetes: Secondary | ICD-10-CM | POA: Diagnosis not present

## 2017-01-25 DIAGNOSIS — E785 Hyperlipidemia, unspecified: Secondary | ICD-10-CM | POA: Diagnosis not present

## 2017-01-25 DIAGNOSIS — E039 Hypothyroidism, unspecified: Secondary | ICD-10-CM | POA: Diagnosis not present

## 2017-01-29 DIAGNOSIS — E785 Hyperlipidemia, unspecified: Secondary | ICD-10-CM | POA: Diagnosis not present

## 2017-01-29 DIAGNOSIS — R7303 Prediabetes: Secondary | ICD-10-CM | POA: Diagnosis not present

## 2017-01-29 DIAGNOSIS — E039 Hypothyroidism, unspecified: Secondary | ICD-10-CM | POA: Diagnosis not present

## 2017-02-06 DIAGNOSIS — J181 Lobar pneumonia, unspecified organism: Secondary | ICD-10-CM | POA: Diagnosis not present

## 2017-02-06 DIAGNOSIS — R05 Cough: Secondary | ICD-10-CM | POA: Diagnosis not present

## 2017-02-12 DIAGNOSIS — J4530 Mild persistent asthma, uncomplicated: Secondary | ICD-10-CM | POA: Diagnosis not present

## 2017-02-12 DIAGNOSIS — J181 Lobar pneumonia, unspecified organism: Secondary | ICD-10-CM | POA: Diagnosis not present

## 2017-06-13 DIAGNOSIS — X32XXXD Exposure to sunlight, subsequent encounter: Secondary | ICD-10-CM | POA: Diagnosis not present

## 2017-06-13 DIAGNOSIS — L821 Other seborrheic keratosis: Secondary | ICD-10-CM | POA: Diagnosis not present

## 2017-06-13 DIAGNOSIS — L570 Actinic keratosis: Secondary | ICD-10-CM | POA: Diagnosis not present

## 2017-06-13 DIAGNOSIS — L905 Scar conditions and fibrosis of skin: Secondary | ICD-10-CM | POA: Diagnosis not present

## 2017-06-29 DIAGNOSIS — H8110 Benign paroxysmal vertigo, unspecified ear: Secondary | ICD-10-CM | POA: Diagnosis not present

## 2017-07-11 DIAGNOSIS — Z01419 Encounter for gynecological examination (general) (routine) without abnormal findings: Secondary | ICD-10-CM | POA: Diagnosis not present

## 2017-07-13 DIAGNOSIS — H8110 Benign paroxysmal vertigo, unspecified ear: Secondary | ICD-10-CM | POA: Diagnosis not present

## 2017-07-18 DIAGNOSIS — Z23 Encounter for immunization: Secondary | ICD-10-CM | POA: Diagnosis not present

## 2017-07-25 DIAGNOSIS — E039 Hypothyroidism, unspecified: Secondary | ICD-10-CM | POA: Diagnosis not present

## 2017-07-25 DIAGNOSIS — R7303 Prediabetes: Secondary | ICD-10-CM | POA: Diagnosis not present

## 2017-07-25 DIAGNOSIS — Z1321 Encounter for screening for nutritional disorder: Secondary | ICD-10-CM | POA: Diagnosis not present

## 2017-08-01 DIAGNOSIS — E039 Hypothyroidism, unspecified: Secondary | ICD-10-CM | POA: Diagnosis not present

## 2017-08-01 DIAGNOSIS — E785 Hyperlipidemia, unspecified: Secondary | ICD-10-CM | POA: Diagnosis not present

## 2017-08-14 DIAGNOSIS — H8110 Benign paroxysmal vertigo, unspecified ear: Secondary | ICD-10-CM | POA: Diagnosis not present

## 2017-10-08 DIAGNOSIS — H8110 Benign paroxysmal vertigo, unspecified ear: Secondary | ICD-10-CM | POA: Diagnosis not present

## 2017-11-06 DIAGNOSIS — B078 Other viral warts: Secondary | ICD-10-CM | POA: Diagnosis not present

## 2017-11-06 DIAGNOSIS — L820 Inflamed seborrheic keratosis: Secondary | ICD-10-CM | POA: Diagnosis not present

## 2017-11-14 DIAGNOSIS — J4531 Mild persistent asthma with (acute) exacerbation: Secondary | ICD-10-CM | POA: Diagnosis not present

## 2017-11-14 DIAGNOSIS — R509 Fever, unspecified: Secondary | ICD-10-CM | POA: Diagnosis not present

## 2018-01-14 DIAGNOSIS — H11153 Pinguecula, bilateral: Secondary | ICD-10-CM | POA: Diagnosis not present

## 2018-01-14 DIAGNOSIS — H10413 Chronic giant papillary conjunctivitis, bilateral: Secondary | ICD-10-CM | POA: Diagnosis not present

## 2018-01-14 DIAGNOSIS — H40013 Open angle with borderline findings, low risk, bilateral: Secondary | ICD-10-CM | POA: Diagnosis not present

## 2018-01-28 DIAGNOSIS — E039 Hypothyroidism, unspecified: Secondary | ICD-10-CM | POA: Diagnosis not present

## 2018-01-31 DIAGNOSIS — E039 Hypothyroidism, unspecified: Secondary | ICD-10-CM | POA: Diagnosis not present

## 2018-04-29 DIAGNOSIS — H10413 Chronic giant papillary conjunctivitis, bilateral: Secondary | ICD-10-CM | POA: Diagnosis not present

## 2018-04-29 DIAGNOSIS — H11153 Pinguecula, bilateral: Secondary | ICD-10-CM | POA: Diagnosis not present

## 2018-04-29 DIAGNOSIS — H40013 Open angle with borderline findings, low risk, bilateral: Secondary | ICD-10-CM | POA: Diagnosis not present

## 2018-07-15 DIAGNOSIS — Z6829 Body mass index (BMI) 29.0-29.9, adult: Secondary | ICD-10-CM | POA: Diagnosis not present

## 2018-07-15 DIAGNOSIS — Z01419 Encounter for gynecological examination (general) (routine) without abnormal findings: Secondary | ICD-10-CM | POA: Diagnosis not present

## 2018-07-29 DIAGNOSIS — E039 Hypothyroidism, unspecified: Secondary | ICD-10-CM | POA: Diagnosis not present

## 2018-07-29 DIAGNOSIS — E785 Hyperlipidemia, unspecified: Secondary | ICD-10-CM | POA: Diagnosis not present

## 2018-07-31 DIAGNOSIS — E039 Hypothyroidism, unspecified: Secondary | ICD-10-CM | POA: Diagnosis not present

## 2018-07-31 DIAGNOSIS — E785 Hyperlipidemia, unspecified: Secondary | ICD-10-CM | POA: Diagnosis not present

## 2019-02-07 DIAGNOSIS — J309 Allergic rhinitis, unspecified: Secondary | ICD-10-CM | POA: Diagnosis not present

## 2019-02-07 DIAGNOSIS — E039 Hypothyroidism, unspecified: Secondary | ICD-10-CM | POA: Diagnosis not present

## 2019-02-10 DIAGNOSIS — R7303 Prediabetes: Secondary | ICD-10-CM | POA: Diagnosis not present

## 2019-02-10 DIAGNOSIS — E785 Hyperlipidemia, unspecified: Secondary | ICD-10-CM | POA: Diagnosis not present

## 2019-02-10 DIAGNOSIS — E039 Hypothyroidism, unspecified: Secondary | ICD-10-CM | POA: Diagnosis not present

## 2021-05-02 ENCOUNTER — Other Ambulatory Visit: Payer: Self-pay

## 2021-05-02 ENCOUNTER — Encounter: Payer: 59 | Attending: Family Medicine | Admitting: Dietician

## 2021-05-02 DIAGNOSIS — R7303 Prediabetes: Secondary | ICD-10-CM

## 2021-05-02 NOTE — Progress Notes (Signed)
On 05/02/2021 patient completed Core Session 1 of Diabetes Prevention Program course virtually with [facility]. The following learning objectives were met by the patient during this class:   Virtual Visit via Video Note  I connected with Caitlin Cox, 5/79/0383 by a video enabled application and verified that I am speaking with the correct person using two identifiers.  Location: Patient: Virtual  Provider: Office    Learning Objectives:  Be able to explain the purpose and benefits of the National Diabetes Prevention Program.  Be able to describe the events that will take place at every session.  Know the weight loss and physical activity goals established by the [facility] Diabetes Prevention Program.  Know their own individual weight loss and physical activity goals.  Be able to explain the important effect of self-monitoring on behavior change.   Goals:  Record food and beverage intake in "Food and Activity Tracker" over the next week.  E-mail completed "Food and Activity Tracker" to Lifestyle Coach next week before session 2. Circle the foods or beverages you think are highest in fat and calories in your food tracker. Read the labels on the food you buy, and consider using measuring cups and spoons to help you calculate the amount you eat. We will talk about measuring in more detail in the coming weeks.   Follow-Up Plan: Attend Core Session 2 next week.  E-mail completed "Food and Activity Tracker" to Lifestyle Coach next week before class.

## 2021-05-09 ENCOUNTER — Encounter: Payer: 59 | Attending: Family Medicine | Admitting: Dietician

## 2021-05-09 ENCOUNTER — Other Ambulatory Visit: Payer: Self-pay

## 2021-05-09 DIAGNOSIS — R7303 Prediabetes: Secondary | ICD-10-CM | POA: Insufficient documentation

## 2021-05-09 NOTE — Progress Notes (Signed)
On 05/09/2021 patient completed Core Session 2 of Diabetes Prevention Program course virtually with [facility]. The following learning objectives were met by the patient during this class:   Virtual Visit via Video Note  I connected with Caitlin Cox, 3/00/9794 on 99/71/82 at  9:00 AM EDT by a video enabled application and verified that I am speaking with the correct person using two identifiers.  Location: Patient: Virtual Provider: Office  Learning Objectives: Self-monitor their weight during the weeks following Session 2.  Describe the relationship between fat and calories.  Explain the reason for, and basic principles of, self-monitoring fat grams and calories.  Identify their personal fat gram goals.  Use the ?Fat and Calorie Counter to calculate the calories and fat grams of a given selection of foods.  Keep a running total of the fat grams they eat each day.  Calculate fat, calories, and serving sizes from nutrition labels.   Goals:  Weigh yourself at the same time each day, or every few days, and record your weight in your Food and Activity Tracker. Write down everything you eat and drink in your Food and Activity Tracker. Measure portions as much as you can, and start reading labels.  Use the ?Fat and Calorie Counter to figure out the amount of fat and calories in what you ate, and write the amount down in your Food and Activity Tracker. Keep a running fat gram total throughout the day. Come as close to your fat gram goal as you can.   Follow-Up Plan: Attend Core Session 3 next week.  Email completed  "Food and Activity Tracker" to Lifestyle Coach next week.

## 2021-05-16 ENCOUNTER — Other Ambulatory Visit: Payer: Self-pay

## 2021-05-16 ENCOUNTER — Encounter: Payer: 59 | Admitting: Dietician

## 2021-05-16 DIAGNOSIS — R7303 Prediabetes: Secondary | ICD-10-CM

## 2021-05-16 NOTE — Progress Notes (Signed)
On 05/16/2021 patient completed Core Session 3 of Diabetes Prevention Program course virtually with [facility]. The following learning objectives were met by the patient during this class:   Virtual Visit via Video Note  I connected with Caitlin Cox, 4/80/1655 on 37/48/27 at  9:00 AM EDT by a video enabled application and verified that I am speaking with the correct person using two identifiers.  Location: Patient: Virtual Provider: Office  Learning Objectives: Weigh and measure foods. Estimate the fat and calorie content of common foods. Describe three ways to eat less fat and fewer calories. Create a plan to eat less fat for the following week.   Goals:  Track weight when weighing outside of class.  Track food and beverages eaten each day in Food and Activity Tracker and include fat grams and calories for each.  Try to stay within fat gram goal.  Complete plan for eating less high fat foods and answer related homework questions.    Follow-Up Plan: Attend Core Session 4 next week.  Bring completed "Food and Activity Tracker" next week to be reviewed by Lifestyle Coach.

## 2021-05-23 ENCOUNTER — Encounter: Payer: 59 | Admitting: Dietician

## 2021-05-23 ENCOUNTER — Other Ambulatory Visit: Payer: Self-pay

## 2021-05-23 DIAGNOSIS — R7303 Prediabetes: Secondary | ICD-10-CM

## 2021-05-23 NOTE — Progress Notes (Signed)
On 05/23/2021 patient completed Core Session 4 of Diabetes Prevention Program course virtually with [facility]. The following learning objectives were met by the patient during this class:   Virtual Visit via Video Note  I connected with Caitlin Cox, 5/61/2548 on 32/34/68 at  9:00 AM EDT by a video enabled application and verified that I am speaking with the correct person using two identifiers.  Location: Patient: Virtual Provider: Office  Learning Objectives: Describe the MyPlate food guide and its recommendations, including how to reduce fat and calories in our diet. Compare and contrast MyPlate guidelines with participants' eating habits. List ways to replace high-fat and high-calorie foods with low-fat and low-calorie foods. Explain the importance of eating plenty of whole grains, vegetables, and fruits, while staying within fat gram goals. Explain the importance of eating foods from all groups of MyPlate and of eating a variety of foods from within each group. Explain why a balanced diet is beneficial to health.  Goals:  Record weight taken outside of class.  Track foods and beverages eaten each day in the "Food and Activity Tracker," including calories and fat grams for each item.  Practice comparing what you eat with the recommendations of MyPlate using the "Rate Your Plate" handout.  Complete the "Rate Your Plate" handout form on at least 3 days.  Answer homework questions.   Follow-Up Plan: Attend Core Session 5 next week.  Email completed "Food and Activity Tracker" next week to be reviewed by Lifestyle Coach.

## 2021-05-30 ENCOUNTER — Ambulatory Visit: Payer: 59 | Admitting: Registered"

## 2021-05-30 ENCOUNTER — Encounter: Payer: Self-pay | Admitting: Registered"

## 2021-05-30 DIAGNOSIS — R7303 Prediabetes: Secondary | ICD-10-CM

## 2021-05-30 NOTE — Progress Notes (Signed)
On 05/30/21 patient completed Core Session 5 of Diabetes Prevention Program course virtually with [facility]. The following learning objectives were met by the patient during this class:   Virtual Visit via Video Note  I connected with Henderson Baltimore on 25/00/37 at  9:00 AM EDT by a video enabled application and verified that I am speaking with the correct person using two identifiers.  Location: Patient: Home.  Provider: Office.   Learning Objectives: Establish a physical activity goal. Explain the importance of the physical activity goal. Describe their current level of physical activity. Name ways that they are already physically active. Develop personal plans for physical activity for the next week.   Goals:  Record weight taken outside of class.  Track foods and beverages eaten each day in the "Food and Activity Tracker," including calories and fat grams for each item.  Make an Activity Plan including date, specific type of activity, and length of time you plan to be active that includes at last 60 minutes of activity for the week.  Track activity type, minutes you were active, and distance you reached each day in the "Food and Activity Tracker."   Follow-Up Plan: Attend Core Session 6 next week.  E-mail completed "Food and Activity Tracker" to Lifestyle Coach next week before class

## 2021-06-06 ENCOUNTER — Other Ambulatory Visit: Payer: Self-pay

## 2021-06-06 ENCOUNTER — Encounter: Payer: 59 | Admitting: Dietician

## 2021-06-06 DIAGNOSIS — R7303 Prediabetes: Secondary | ICD-10-CM

## 2021-06-06 NOTE — Progress Notes (Signed)
On 06/06/2021 patient completed Core Session 6 of Diabetes Prevention Program course virtually with [facility]. The following learning objectives were met by the patient during this class:   Virtual Visit via Video Note  I connected with Coralyn Mark L. [place], 06/24/3845 on 65/99/35 at  9:00 AM EDT by a video enabled application and verified that I am speaking with the correct person using two identifiers.  Location: Patient: Virtual Provider: Office  Learning Objectives: Graph their daily physical activity.  Describe two ways of finding the time to be active.  Define "lifestyle activity."  Describe how to prevent injury.  Develop an activity plan for the coming week.   Goals:  Record weight taken outside of class.  Track foods and beverages eaten each day in the "Food and Activity Tracker," including calories and fat grams for each item.   Track activity type, minutes you were active, and distance you reached each day in the "Food and Activity Tracker."  Set aside one 20 to 30-minute block of time every day or find two or more periods of 10 to15 minutes each for physical activity.  Warm up, cool down, and stretch. Make a Physical Activities Plan for the Week.   Follow-Up Plan: Attend Core Session 7 next week.  E-mail completed "Food and Activity Tracker" to Lifestyle Coach next week before class

## 2021-06-20 ENCOUNTER — Other Ambulatory Visit: Payer: Self-pay

## 2021-06-20 ENCOUNTER — Encounter: Payer: 59 | Attending: Family Medicine | Admitting: Dietician

## 2021-06-20 DIAGNOSIS — R7303 Prediabetes: Secondary | ICD-10-CM | POA: Insufficient documentation

## 2021-06-20 NOTE — Progress Notes (Signed)
On 06/20/2021 patient completed Core Session 7 of Diabetes Prevention Program course virtually with [facility]. The following learning objectives were met by the patient during this class:   Virtual Visit via Video Note  I connected with Coralyn Mark L. [place], 9[birth year]53/[birth year]22 on 30/09/79 at  9:00 AM EDT by a video enabled application and verified that I am speaking with the correct person using two identifiers.  Location: Patient: Virtual Provider: Office  Learning Objectives: Define calorie balance. Explain how healthy eating and being active are related in terms of calorie balance.  Describe the relationship between calorie balance and weight loss.  Describe his or her progress as it relates to calorie balance.  Develop an activity plan for the coming week.   Goals:  Record weight taken outside of class.  Track foods and beverages eaten each day in the "Food and Activity Tracker," including calories and fat grams for each item.   Track activity type, minutes you were active, and distance you reached each day in the "Food and Activity Tracker."  Set aside one 20 to 30-minute block of time every day or find two or more periods of 10 to15 minutes each for physical activity.  Make a Physical Activities Plan for the Week.  Make active lifestyle choices all through the day  Stay at or go slightly over activity goal.   Follow-Up Plan: Attend Core Session 8 next week.  E-mail completed "Food and Activity Tracker" to Lifestyle Coach next week before class

## 2021-06-27 ENCOUNTER — Other Ambulatory Visit: Payer: Self-pay

## 2021-06-27 ENCOUNTER — Encounter: Payer: 59 | Admitting: Dietician

## 2021-06-27 DIAGNOSIS — R7303 Prediabetes: Secondary | ICD-10-CM

## 2021-06-27 NOTE — Progress Notes (Signed)
On 06/27/2021 patient completed Core Session 8 of Diabetes Prevention Program course virtually with [facility]. The following learning objectives were met by the patient during this class:   Virtual Visit via Video Note  I connected with Coralyn Mark L. [place], 8/00/3491 by a video enabled application and verified that I am speaking with the correct person using two identifiers.  Location: Patient: Virtual Provider: Office  Learning Objectives: Recognize positive and negative food and activity cues.  Change negative food and activity cues to positive cues.  Add positive cues for activity and eliminate cues for inactivity.  Develop a plan for removing one problem food cue for the coming week.   Goals:  Record weight taken outside of class.  Track foods and beverages eaten each day in the "Food and Activity Tracker," including calories and fat grams for each item.   Track activity type, minutes you were active, and distance you reached each day in the "Food and Activity Tracker."  Set aside one 20 to 30-minute block of time every day or find two or more periods of 10 to15 minutes each for physical activity.  Remove one problem food cue.  Add one positive cue for being more active.  Follow-Up Plan: Attend Core Session 9 next week.  Email completed "Food and Activity Tracker" next week to be reviewed by Lifestyle Coach.

## 2021-07-11 ENCOUNTER — Other Ambulatory Visit: Payer: Self-pay

## 2021-07-11 ENCOUNTER — Encounter: Payer: 59 | Attending: Family Medicine | Admitting: Dietician

## 2021-07-11 DIAGNOSIS — R7303 Prediabetes: Secondary | ICD-10-CM | POA: Insufficient documentation

## 2021-07-11 NOTE — Progress Notes (Signed)
On 07/11/2021 patient completed Core Session 9 of Diabetes Prevention Program course virtually with [facility]. The following learning objectives were met by the patient during this class:   Virtual Visit via Video Note  I connected with Coralyn Mark L. [place], 0/34/7425 by a video enabled application and verified that I am speaking with the correct person using two identifiers.  Location: Patient: Virtual Provider: Office  Learning Objectives: List and describe five steps to problem solving.  Apply the five problem solving steps to resolve a problem he or she has with eating less fat and fewer calories or being more active.   Goals:  Record weight taken outside of class.  Track foods and beverages eaten each day in the "Food and Activity Tracker," including calories and fat grams for each item.   Track activity type, minutes you were active, and distance you reached each day in the "Food and Activity Tracker."  Set aside one 20 to 30-minute block of time every day or find two or more periods of 10 to15 minutes each for physical activity.  Use problem solving action plan created during session to problem solve.   Follow-Up Plan: Attend Core Session 10 next week.  Email completed "Food and Activity Tracker" next week to be reviewed by Lifestyle Coach. Email menus from favorite restaurants to next session for future discussion.

## 2021-07-18 ENCOUNTER — Encounter: Payer: 59 | Admitting: Dietician

## 2021-07-18 ENCOUNTER — Other Ambulatory Visit: Payer: Self-pay

## 2021-07-18 DIAGNOSIS — R7303 Prediabetes: Secondary | ICD-10-CM

## 2021-07-18 NOTE — Progress Notes (Signed)
On 07/18/2021 patient completed Core Session 10 of Diabetes Prevention Program course virtually with [facility]. The following learning objectives were met by the patient during this class:   Virtual Visit via Video Note  I connected with Coralyn Mark L. [place], 8/49/8651 by a video enabled application and verified that I am speaking with the correct person using two identifiers.  Location: Patient: Virtual Provider: Office  Learning Objectives: List and describe the four keys for healthy eating out.  Give examples of how to apply these keys at the type of restaurants that the participants go to regularly.  Make an appropriate meal selection from a restaurant menu.  Demonstrate how to ask for a substitute item using assertive language and a polite tone of voice.    Goals:  Record weight taken outside of class.  Track foods and beverages eaten each day in the "Food and Activity Tracker," including calories and fat grams for each item.   Track activity type, minutes you were active, and distance you reached each day in the "Food and Activity Tracker."  Set aside one 20 to 30-minute block of time every day or find two or more periods of 10 to15 minutes each for physical activity.  Utilize positive action plan and complete questions on "To Do List."   Follow-Up Plan: Attend Core Session 11.  Email completed "Food and Activity Tracker" to be reviewed by Lifestyle Coach.

## 2021-07-25 ENCOUNTER — Other Ambulatory Visit: Payer: Self-pay

## 2021-07-25 ENCOUNTER — Encounter: Payer: 59 | Admitting: Dietician

## 2021-07-25 DIAGNOSIS — R7303 Prediabetes: Secondary | ICD-10-CM

## 2021-07-25 NOTE — Progress Notes (Signed)
On 08/04/2021 patient completed Session 11 of Diabetes Prevention Program course virtually with [facility]. By the end of this session patients are able to complete the following objectives:   Virtual Visit via Video Note  I connected with Caitlin Cox by a video enabled application and verified that I am speaking with the correct person using two identifiers.  Location: Patient: Virtual Provider: Office  Learning Objectives: Give examples of negative thoughts that could prevent them from meeting their goals of losing weight and being more physically active.  Describe how to stop negative thoughts and talk back to them with positive thoughts.  Practice 1) stopping negative thoughts and 2) talking back to negative thoughts with positive ones.    Goals:  Record weight taken outside of class.  Track foods and beverages eaten each day in the "Food and Activity Tracker," including calories and fat grams for each item.   Track activity type, minutes you were active, and distance you reached each day in the "Food and Activity Tracker."  If you have any negative thoughts-write them in your Food and Activity Trackers, along with how you talked back to them. Practice stopping negative thoughts and talking back to them with positive thoughts.   Follow-Up Plan: Attend Core Session 12 next week.  Email completed "Food and Activity Tracker" before next week to be reviewed by Lifestyle Coach.

## 2021-08-08 ENCOUNTER — Other Ambulatory Visit: Payer: Self-pay

## 2021-08-08 ENCOUNTER — Encounter: Payer: 59 | Admitting: Dietician

## 2021-08-08 DIAGNOSIS — R7303 Prediabetes: Secondary | ICD-10-CM

## 2021-08-08 NOTE — Progress Notes (Signed)
Patient was seen on 08/08/2021 for the Core Session 12 of Diabetes Prevention Program course at [facility]. By the end of this session patients are able to complete the following objectives:   Virtual Visit via Video Note  I connected with Caitlin Cox, [birth year] on 91/79/15 at  9:00 AM EDT by a video enabled application and verified that I am speaking with the correct person using two identifiers.  Location: Patient: Virtual Provider: Office  Learning Objectives: Describe their current progress toward defined goals. Describe common causes for slipping from healthy eating or being active. Explain what to do to get back on their feet after a slip.  Goals:  Record weight taken outside of class.  Track foods and beverages eaten each day in the "Food and Activity Tracker," including calories and fat grams for each item.   Track activity type, minutes active, and distance reached each day in the "Food and Activity Tracker."  Try out the two action plans created during session- "Slips from Healthy Eating: Action Plan" and "Slips from Being Active: Action Plan" Answer questions on the handout.   Follow-Up Plan: Attend Core Session 13 next week.  Bring completed "Food and Activity Tracker" next week to be reviewed by Lifestyle Coach.

## 2021-08-15 ENCOUNTER — Other Ambulatory Visit: Payer: Self-pay

## 2021-08-15 ENCOUNTER — Encounter: Payer: 59 | Attending: Family Medicine | Admitting: Dietician

## 2021-08-15 DIAGNOSIS — R7303 Prediabetes: Secondary | ICD-10-CM | POA: Insufficient documentation

## 2021-08-15 NOTE — Progress Notes (Signed)
On 08/15/2021 patient completed the Core Session 13 of Diabetes Prevention Program course virtually with [facility]. By the end of this session patients are able to complete the following objectives:   Virtual Visit via Video Note  I connected with Caitlin Cox, 6/60/6301 on 60/10/93 at  9:00 AM EST by a video enabled application and verified that I am speaking with the correct person using two identifiers.  Location: Patient: Virtual Provider: Office  Learning Objectives: Describe ways to add interest and variety to their activity plans. Define ?aerobic fitness. Explain the four F.I.T.T. principles (frequency, intensity, time, and type of activity) and how they relate to aerobic fitness.   Goals:  Record weight taken outside of class.  Track foods and beverages eaten each day in the "Food and Activity Tracker," including calories and fat grams for each item.   Track activity type, minutes you were active, and distance you reached each day in the "Food and Activity Tracker."  Do your best to reach activity goal for the week. Use one of the F.I.T.T. principles to jump start workouts. Document activity level on the "To Do Next Week" handout.  Follow-Up Plan: Attend Core Session 14 next week.  Email completed "Food and Activity Tracker" before next week to be reviewed by Lifestyle Coach.

## 2021-08-29 ENCOUNTER — Other Ambulatory Visit: Payer: Self-pay

## 2021-08-29 ENCOUNTER — Encounter: Payer: 59 | Admitting: Dietician

## 2021-08-29 DIAGNOSIS — R7303 Prediabetes: Secondary | ICD-10-CM

## 2021-08-29 NOTE — Progress Notes (Signed)
On 08/29/2021 patient completed Core Session 14 of Diabetes Prevention Program course virtually with [facility]. By the end of this session patients are able to complete the following objectives:   Virtual Visit via Video Note  I connected with Caitlin Cox, 1/61/0960 on 45/40/98 at  9:00 AM EST by a video enabled application and verified that I am speaking with the correct person using two identifiers.  Location: Patient: Virtual Provider: Office  Learning Objectives: Give examples of problem social cues and helpful social cues.  Explain how to remove problem social cues and add helpful ones.  Describe ways of coping with vacations and social events such as parties, holidays, and visits from relatives and friends.  Create an action plan to change a problem social cue and add a helpful one.   Goals:  Record weight taken outside of class.  Track foods and beverages eaten each day in the "Food and Activity Tracker," including calories and fat grams for each item.   Track activity type, minutes you were active, and distance you reached each day in the "Food and Activity Tracker."  Do your best to reach activity goal for the week. Use action plan created during session to change a problem social cue and add a helpful social cue.  Answer questions regarding success of changing social cues on "To Do Next Week" handout.   Follow-Up Plan: Attend Core Session 15 next week.  Email completed "Food and Activity Tracker" before next week to be reviewed by Lifestyle Coach.

## 2021-09-05 ENCOUNTER — Encounter: Payer: 59 | Admitting: Dietician

## 2021-09-05 ENCOUNTER — Other Ambulatory Visit: Payer: Self-pay

## 2021-09-05 DIAGNOSIS — R7303 Prediabetes: Secondary | ICD-10-CM

## 2021-09-05 NOTE — Progress Notes (Signed)
On 09/05/2021 patient completed Core Session 15 of Diabetes Prevention Program course virtually with [facility]. By the end of this session patients are able to complete the following objectives:   Virtual Visit via Video Note  I connected with Caitlin Cox, 1/61/0960 on 45/40/98 at  9:00 AM EST by a video enabled application and verified that I am speaking with the correct person using two identifiers.  Location: Patient: Virtual Provider: Office  Learning Objectives: Explain how to prevent stress or cope with unavoidable stress.  Describe how this program can be a source of stress.  Explain how to manage stressful situations.  Create and follow an action plan for either preventing or coping with a stressful situation.   Goals:  Record weight taken outside of class.  Track foods and beverages eaten each day in the "Food and Activity Tracker," including calories and fat grams for each item.   Track activity type, minutes you were active, and distance you reached each day in the "Food and Activity Tracker."  Do your best to reach activity goal for the week. Follow your action plan to reduce stress.  Answer questions on handout regarding success of action plan.   Follow-Up Plan: Attend Core Session 16 next week.  Email completed "Food and Activity Tracker" before next week to be reviewed by Lifestyle Coach.

## 2021-09-12 ENCOUNTER — Other Ambulatory Visit: Payer: Self-pay

## 2021-09-12 ENCOUNTER — Encounter: Payer: 59 | Attending: Family Medicine | Admitting: Dietician

## 2021-09-12 DIAGNOSIS — R7303 Prediabetes: Secondary | ICD-10-CM | POA: Insufficient documentation

## 2021-09-12 NOTE — Progress Notes (Signed)
On 09/12/2021 patient completed Core Session 16 of Diabetes Prevention Program course virtually with [facility]. By the end of this session patients are able to complete the following objectives:   Virtual Visit via Video Note  I connected with Coralyn Mark L. [place], 06[birth year]01[birth year]23[birth year] on 61/44/31 at  9:00 AM EST by a video enabled application and verified that I am speaking with the correct person using two identifiers.  Location: Patient: Virtual Provider: Office  Learning Objectives: Measure their progress toward weight and physical activity goals since Session 1.  Develop a plan for improving progress, if their goals have not yet been attained.  Describe ways to stay motivated long-term.   Goals:  Record weight taken outside of class.  Track foods and beverages eaten each day in the "Food and Activity Tracker," including calories and fat grams for each item.   Track activity type, minutes you were active, and distance you reached each day in the "Food and Activity Tracker."  Utilize action plan to help stay motivated and complete questions on "To Do List."   Follow-Up Plan: Attend session 17 in two weeks.  Email completed "Food and Activity Tracker" before next session to be reviewed by Lifestyle Coach.

## 2021-09-19 ENCOUNTER — Encounter: Payer: 59 | Admitting: Dietician

## 2021-09-19 ENCOUNTER — Other Ambulatory Visit: Payer: Self-pay

## 2021-09-19 DIAGNOSIS — R7303 Prediabetes: Secondary | ICD-10-CM

## 2021-09-19 NOTE — Progress Notes (Signed)
On 09/08/2021 patient completed a post core session of the Diabetes Prevention Program course virtually with [facility]. By the end of this session patients are able to complete the following objectives:   Virtual Visit via Video Note  I connected with Coralyn Mark L. [place], 2/58/5277 by a video enabled application and verified that I am speaking with the correct person using two identifiers.  Location: Patient: Virtual Provider: Office  Learning Objectives: Identify how to maintain and/or continue working toward program goals for the remainder of the program.  Describe ways that food and activity tracking can assist them in maintaining/reaching program goals.  Identify progress they have made since the beginning of the program.   Goals:  Record weight taken outside of class.  Track foods and beverages eaten each day in the "Food and Activity Tracker," including calories and fat grams for each item.   Track activity type, minutes you were active, and distance you reached each day in the "Food and Activity Tracker."   Follow-Up Plan: Attend session 18 in two weeks.  Email completed "Food and Activity Trackers" before next session to be reviewed by Lifestyle Coach.

## 2021-09-26 ENCOUNTER — Encounter: Payer: 59 | Admitting: Dietician

## 2021-09-26 ENCOUNTER — Other Ambulatory Visit: Payer: Self-pay

## 2021-09-26 DIAGNOSIS — R7303 Prediabetes: Secondary | ICD-10-CM

## 2021-09-26 NOTE — Progress Notes (Signed)
On 09/26/2021 patient completed a post core session of the Diabetes Prevention Program course virtually with [facility]. By the end of this session patients are able to complete the following objectives:   Virtual Visit via Video Note  I connected with Caitlin Cox, 03/15/3015 by a video enabled application and verified that I am speaking with the correct person using two identifiers.  Location: Patient: Virtual Provider: Office  Learning Objectives: Describe the differences between unsaturated, saturated, and trans fat on heart health.  List dietary sources of unsaturated, saturated, and trans fats. Explain ways to reduce intake of saturated fat and replace them with heart healthy fats.  Goals:  Record weight taken outside of class.  Track foods and beverages eaten each day in the "Food and Activity Tracker," including calories and fat grams for each item.   Track activity type, minutes you were active, and distance you reached each day in the "Food and Activity Tracker."   Follow-Up Plan: Attend next session.  Email completed "Food and Activity Trackers" before next session to be reviewed by Lifestyle Coach.

## 2021-10-17 ENCOUNTER — Encounter: Payer: 59 | Attending: Family Medicine | Admitting: Dietician

## 2021-10-17 ENCOUNTER — Other Ambulatory Visit: Payer: Self-pay

## 2021-10-17 DIAGNOSIS — R7303 Prediabetes: Secondary | ICD-10-CM | POA: Insufficient documentation

## 2021-10-17 NOTE — Progress Notes (Signed)
On 10/17/2021 patient completed a post core session of the Diabetes Prevention Program course virtually with [facility]. By the end of this session patients are able to complete the following objectives:   Virtual Visit via Video Note  I connected with Coralyn Mark L. [place], 0/90/3014 on 99/69/24 at  9:00 AM EST by a video enabled application and verified that I am speaking with the correct person using two identifiers.  Location: Patient: Virtual Provider: Office  Learning Objectives: List risk factors for heart disease.  Define the difference between HDL and LDL cholesterol List ways to reduce risk for heart disease.   Goals:  Record weight taken outside of class.  Track foods and beverages eaten each day in the "Food and Activity Tracker," including calories and fat grams for each item.   Track activity type, minutes you were active, and distance you reached each day in the "Food and Activity Tracker."   Follow-Up Plan: Attend next session.  Email completed "Food and Activity Trackers" before next session to be reviewed by Lifestyle Coach.

## 2021-10-31 ENCOUNTER — Encounter: Payer: 59 | Admitting: Dietician

## 2021-10-31 ENCOUNTER — Other Ambulatory Visit: Payer: Self-pay

## 2021-10-31 DIAGNOSIS — R7303 Prediabetes: Secondary | ICD-10-CM

## 2021-10-31 NOTE — Progress Notes (Signed)
On 10/31/2021 patient completed a post core session of the Diabetes Prevention Program course virtually with [facility]. By the end of this session patients are able to complete the following objectives:   Virtual Visit via Video Note  I connected with Coralyn Mark L. [place], 7/41/4239 by a video enabled application and verified that I am speaking with the correct person using two identifiers.  Location: Patient: Virtual Provider: Office  Learning Objectives: Describe how to incorporate more fruits and vegetables into meals. List criteria for selecting good quality fruits and vegetables at the store.  Define mindful eating. List the benefits of eating mindfully.   Goals:  Record weight taken outside of class.  Track foods and beverages eaten each day in the "Food and Activity Tracker," including calories and fat grams for each item.   Track activity type, minutes you were active, and distance you reached each day in the "Food and Activity Tracker."   Follow-Up Plan: Attend next session.  Email completed "Food and Activity Trackers" before next session to be reviewed by Lifestyle Coach.

## 2021-11-14 ENCOUNTER — Encounter: Payer: 59 | Attending: Family Medicine | Admitting: Dietician

## 2021-11-14 ENCOUNTER — Other Ambulatory Visit: Payer: Self-pay

## 2021-11-14 DIAGNOSIS — R7303 Prediabetes: Secondary | ICD-10-CM | POA: Insufficient documentation

## 2021-11-14 NOTE — Progress Notes (Signed)
On 11/14/2021 patient completed the Diabetes Prevention Program course virtually with [facility]. By the end of this session patients are able to complete the following objectives:   Virtual Visit via Video Note  I connected with Caitlin Cox, 04/01/7627 by a video enabled application and verified that I am speaking with the correct person using two identifiers.  Location: Patient: Virtual Provider: Office  Learning Objectives: Define fiber and describe the difference between insoluble and soluble fiber  List foods that are good sources of fiber Explain the health benefits of fiber  Describe ways to increase volume of meals and snacks while staying within fat goal.   Goals:  Record weight taken outside of class.  Track foods and beverages eaten each day in the "Food and Activity Tracker," including calories and fat grams for each item.   Track activity type, minutes you were active, and distance you reached each day in the "Food and Activity Tracker."   Follow-Up Plan: Attend next session.  Email completed "Food and Activity Trackers" before next session to be reviewed by Lifestyle Coach.

## 2021-12-26 ENCOUNTER — Encounter: Payer: 59 | Attending: Family Medicine | Admitting: Dietician

## 2021-12-26 ENCOUNTER — Other Ambulatory Visit: Payer: Self-pay

## 2021-12-26 DIAGNOSIS — R7303 Prediabetes: Secondary | ICD-10-CM | POA: Insufficient documentation

## 2021-12-26 NOTE — Progress Notes (Signed)
On 12/21/2021 patient attended a virtual grocery store tour session as part of the Diabetes Prevention Program with [facility]. ? ?Virtual Visit via Video Note ? ?I connected with Caitlin Cox, 4/78/2956 by a video enabled application and verified that I am speaking with the correct person using two identifiers. ? ?Location: ?Patient: Virtual ?Provider: Office ? ? Learning Objectives: ?Develop a plan for our grocery shopping experience ?Putting together a list ?How to navigate the grocery store ?Identify 4 main sections of the grocery store ?Produce ?Meat/Poultry/Fish ?Dairy  ?Inside Aisles ?Consider tips for shopping in each of these four sections ?Reflect on our own shopping habits ?Create a new goal for our next grocery shopping experience ?Engage in a group discussion ? ?Goals:  ?Record weight taken outside of class.  ?Track foods and beverages eaten each day in the "Food and Activity Tracker," including calories and fat grams for each item.  ?Create one new goal for the next grocery shopping experience based on the information provided today ? ?Follow-Up Plan: ?Attend next session.  ?Email completed "Food and Activity Tracker" before next session to be reviewed by Lifestyle Coach.  ? ?

## 2022-01-09 DIAGNOSIS — R7309 Other abnormal glucose: Secondary | ICD-10-CM | POA: Diagnosis not present

## 2022-01-09 DIAGNOSIS — J4530 Mild persistent asthma, uncomplicated: Secondary | ICD-10-CM | POA: Diagnosis not present

## 2022-01-09 DIAGNOSIS — E785 Hyperlipidemia, unspecified: Secondary | ICD-10-CM | POA: Diagnosis not present

## 2022-01-09 DIAGNOSIS — K219 Gastro-esophageal reflux disease without esophagitis: Secondary | ICD-10-CM | POA: Diagnosis not present

## 2022-01-09 DIAGNOSIS — E039 Hypothyroidism, unspecified: Secondary | ICD-10-CM | POA: Diagnosis not present

## 2022-01-09 DIAGNOSIS — Z0000 Encounter for general adult medical examination without abnormal findings: Secondary | ICD-10-CM | POA: Diagnosis not present

## 2022-01-09 DIAGNOSIS — Z23 Encounter for immunization: Secondary | ICD-10-CM | POA: Diagnosis not present

## 2022-01-16 DIAGNOSIS — H43393 Other vitreous opacities, bilateral: Secondary | ICD-10-CM | POA: Diagnosis not present

## 2022-01-16 DIAGNOSIS — H10413 Chronic giant papillary conjunctivitis, bilateral: Secondary | ICD-10-CM | POA: Diagnosis not present

## 2022-01-16 DIAGNOSIS — H40013 Open angle with borderline findings, low risk, bilateral: Secondary | ICD-10-CM | POA: Diagnosis not present

## 2022-01-16 DIAGNOSIS — H2513 Age-related nuclear cataract, bilateral: Secondary | ICD-10-CM | POA: Diagnosis not present

## 2022-01-18 DIAGNOSIS — M25512 Pain in left shoulder: Secondary | ICD-10-CM | POA: Diagnosis not present

## 2022-01-23 ENCOUNTER — Encounter: Payer: Self-pay | Attending: Family Medicine | Admitting: Dietician

## 2022-01-23 DIAGNOSIS — R7303 Prediabetes: Secondary | ICD-10-CM | POA: Insufficient documentation

## 2022-01-23 NOTE — Progress Notes (Signed)
On 01/23/2022 patient completed a post core session of the Diabetes Prevention Program virtually with [facility]. By the end of this session patients are able to complete the following objectives:  ? ?Virtual Visit via Video Note ? ?I connected with Caitlin Cox, 9/45/8592 by a video enabled application and verified that I am speaking with the correct person using two identifiers. ? ?Location: ?Patient: Virtual ?Provider: Office ? ?Learning Objectives: ?List indoor physical activity options.  ?Identify any barriers to being active and brainstorm how to overcome barriers.  ?Describe short and long-term health benefits of physical activity.  ? ?Goals:  ?Record weight taken outside of class.  ?Track foods and beverages eaten each day in the "Food and Activity Tracker," including calories and fat grams for each item.   ?Track activity type, minutes you were active, and distance you reached each day in the "Food and Activity Tracker."  ? ?Follow-Up Plan: ?Attend next session.  ?Email completed "Food and Activity Trackers" before next session to be reviewed by Lifestyle Coach. ? ?

## 2022-02-01 DIAGNOSIS — Z1231 Encounter for screening mammogram for malignant neoplasm of breast: Secondary | ICD-10-CM | POA: Diagnosis not present

## 2022-02-15 DIAGNOSIS — M25512 Pain in left shoulder: Secondary | ICD-10-CM | POA: Diagnosis not present

## 2022-02-20 ENCOUNTER — Encounter: Payer: Self-pay | Admitting: Dietician

## 2022-02-20 ENCOUNTER — Encounter: Payer: Self-pay | Attending: Family Medicine | Admitting: Dietician

## 2022-02-20 DIAGNOSIS — R7303 Prediabetes: Secondary | ICD-10-CM | POA: Insufficient documentation

## 2022-02-20 NOTE — Progress Notes (Addendum)
On 02/20/2022 patient completed a post core session of the Diabetes Prevention Program course virtually with [facility]. By the end of this session patients are able to complete the following objectives:  ? ?Virtual Visit via Video Note ? ?I connected with Caitlin Cox by a video enabled application and verified that I am speaking with the correct person using two identifiers. ?  ?I discussed the limitations of evaluation and management by telemedicine and the availability of in person appointments. The patient expressed understanding and agreed to proceed. ? ?Location: ?Patient: home (virtual) ?Provider: office ? ?Learning Objectives: ?Identify which foods contain carbohydrates.  ?List functions for carbohydrates on the body.  ?Describe the relationship between carbohydrate intake and blood sugar.  ?Create balanced snack choices.  ? ?Goals:  ?Record weight taken outside of class.  ?Track foods and beverages eaten each day in the "Food and Activity Tracker," including calories and fat grams for each item.   ?Track activity type, minutes you were active, and distance you reached each day in the "Food and Activity Tracker."  ? ?Follow-Up Plan: ?Attend next session.  ?Email completed "Food and Activity Trackers" before next session to be reviewed by Lifestyle Coach.  ?

## 2022-03-27 ENCOUNTER — Encounter: Payer: Self-pay | Attending: Family Medicine | Admitting: Dietician

## 2022-03-27 ENCOUNTER — Encounter: Payer: Self-pay | Admitting: Dietician

## 2022-03-27 DIAGNOSIS — R7303 Prediabetes: Secondary | ICD-10-CM | POA: Insufficient documentation

## 2022-03-27 NOTE — Progress Notes (Signed)
On 03/27/2022 patient completed a post core session of the Diabetes Prevention Program course virtually with [facility]. By the end of this session patients are able to complete the following objectives:   Virtual Visit via Video Note  I connected with Caitlin Cox by a video enabled application and verified that I am speaking with the correct person using two identifiers.  Location: Patient: home (virtual) Provider: office  Learning Objectives: List 9 ways to to stay on track during special events/occasions. Create a plan to avoid a food problem at an upcoming special event/occasion Describe ways to make time for healthy behaviors during special events/occasions   Goals:  Record weight taken outside of class.  Track foods and beverages eaten each day in the "Food and Activity Tracker," including calories and fat grams for each item.   Track activity type, minutes you were active, and distance you reached each day in the "Food and Activity Tracker."   Follow-Up Plan: Attend next session.  Email completed "Food and Activity Trackers" before next session to be reviewed by Lifestyle Coach.

## 2022-03-31 DIAGNOSIS — L821 Other seborrheic keratosis: Secondary | ICD-10-CM | POA: Diagnosis not present

## 2022-03-31 DIAGNOSIS — L538 Other specified erythematous conditions: Secondary | ICD-10-CM | POA: Diagnosis not present

## 2022-03-31 DIAGNOSIS — L820 Inflamed seborrheic keratosis: Secondary | ICD-10-CM | POA: Diagnosis not present

## 2022-04-05 DIAGNOSIS — M25512 Pain in left shoulder: Secondary | ICD-10-CM | POA: Diagnosis not present

## 2022-04-19 DIAGNOSIS — E039 Hypothyroidism, unspecified: Secondary | ICD-10-CM | POA: Diagnosis not present

## 2022-04-19 DIAGNOSIS — E785 Hyperlipidemia, unspecified: Secondary | ICD-10-CM | POA: Diagnosis not present

## 2022-04-19 DIAGNOSIS — R7309 Other abnormal glucose: Secondary | ICD-10-CM | POA: Diagnosis not present

## 2022-04-19 DIAGNOSIS — R7303 Prediabetes: Secondary | ICD-10-CM | POA: Diagnosis not present

## 2022-04-19 DIAGNOSIS — Z1159 Encounter for screening for other viral diseases: Secondary | ICD-10-CM | POA: Diagnosis not present

## 2022-04-24 ENCOUNTER — Encounter: Payer: Self-pay | Attending: Family Medicine | Admitting: Registered"

## 2022-04-24 DIAGNOSIS — E785 Hyperlipidemia, unspecified: Secondary | ICD-10-CM | POA: Diagnosis not present

## 2022-04-24 DIAGNOSIS — R7303 Prediabetes: Secondary | ICD-10-CM | POA: Insufficient documentation

## 2022-04-24 DIAGNOSIS — J309 Allergic rhinitis, unspecified: Secondary | ICD-10-CM | POA: Diagnosis not present

## 2022-04-24 DIAGNOSIS — M8580 Other specified disorders of bone density and structure, unspecified site: Secondary | ICD-10-CM | POA: Diagnosis not present

## 2022-04-24 DIAGNOSIS — E039 Hypothyroidism, unspecified: Secondary | ICD-10-CM | POA: Diagnosis not present

## 2022-04-24 DIAGNOSIS — J4530 Mild persistent asthma, uncomplicated: Secondary | ICD-10-CM | POA: Diagnosis not present

## 2022-04-27 ENCOUNTER — Encounter: Payer: Self-pay | Admitting: Registered"

## 2022-04-27 NOTE — Progress Notes (Signed)
On 04/24/22 pt completed a post core session of the Diabetes Prevention Program course virtually with [facility]. By the end of this session patients are able to complete the following objectives:   Virtual Visit via Video Note  I connected with [employer] by a video enabled application and verified that I am speaking with the correct person using two identifiers.  Location: Patient: Home.  Provider: Office.   Learning Objectives: Reflect on lifestyle changes they have made since starting the DPP.  Set long-term goals to promote continued maintenance of lifestyle changes made during the program.   Goals:  Work toward reaching new long-term goals set during class.   Follow-Up Plan: Contact Lifestyle Coach with questions/concerns PRN.

## 2022-05-03 DIAGNOSIS — M25512 Pain in left shoulder: Secondary | ICD-10-CM | POA: Diagnosis not present

## 2022-05-24 DIAGNOSIS — M25512 Pain in left shoulder: Secondary | ICD-10-CM | POA: Diagnosis not present

## 2022-06-21 DIAGNOSIS — M25512 Pain in left shoulder: Secondary | ICD-10-CM | POA: Diagnosis not present

## 2022-07-12 DIAGNOSIS — M25512 Pain in left shoulder: Secondary | ICD-10-CM | POA: Diagnosis not present

## 2022-07-14 DIAGNOSIS — L821 Other seborrheic keratosis: Secondary | ICD-10-CM | POA: Diagnosis not present

## 2022-07-14 DIAGNOSIS — Z1283 Encounter for screening for malignant neoplasm of skin: Secondary | ICD-10-CM | POA: Diagnosis not present

## 2022-07-31 DIAGNOSIS — H35373 Puckering of macula, bilateral: Secondary | ICD-10-CM | POA: Diagnosis not present

## 2022-07-31 DIAGNOSIS — H40013 Open angle with borderline findings, low risk, bilateral: Secondary | ICD-10-CM | POA: Diagnosis not present

## 2022-07-31 DIAGNOSIS — H2513 Age-related nuclear cataract, bilateral: Secondary | ICD-10-CM | POA: Diagnosis not present

## 2022-07-31 DIAGNOSIS — H43393 Other vitreous opacities, bilateral: Secondary | ICD-10-CM | POA: Diagnosis not present

## 2022-07-31 DIAGNOSIS — H11153 Pinguecula, bilateral: Secondary | ICD-10-CM | POA: Diagnosis not present

## 2022-07-31 DIAGNOSIS — H04123 Dry eye syndrome of bilateral lacrimal glands: Secondary | ICD-10-CM | POA: Diagnosis not present

## 2022-07-31 DIAGNOSIS — H10413 Chronic giant papillary conjunctivitis, bilateral: Secondary | ICD-10-CM | POA: Diagnosis not present

## 2022-08-02 DIAGNOSIS — M25512 Pain in left shoulder: Secondary | ICD-10-CM | POA: Diagnosis not present

## 2022-08-23 DIAGNOSIS — M25512 Pain in left shoulder: Secondary | ICD-10-CM | POA: Diagnosis not present

## 2022-09-27 DIAGNOSIS — M25512 Pain in left shoulder: Secondary | ICD-10-CM | POA: Diagnosis not present

## 2022-10-10 DIAGNOSIS — N958 Other specified menopausal and perimenopausal disorders: Secondary | ICD-10-CM | POA: Diagnosis not present

## 2022-10-17 DIAGNOSIS — R69 Illness, unspecified: Secondary | ICD-10-CM | POA: Diagnosis not present

## 2022-10-25 DIAGNOSIS — E039 Hypothyroidism, unspecified: Secondary | ICD-10-CM | POA: Diagnosis not present

## 2022-10-26 DIAGNOSIS — Z6829 Body mass index (BMI) 29.0-29.9, adult: Secondary | ICD-10-CM | POA: Diagnosis not present

## 2022-10-26 DIAGNOSIS — J45909 Unspecified asthma, uncomplicated: Secondary | ICD-10-CM | POA: Diagnosis not present

## 2022-10-30 DIAGNOSIS — Z6829 Body mass index (BMI) 29.0-29.9, adult: Secondary | ICD-10-CM | POA: Diagnosis not present

## 2022-10-30 DIAGNOSIS — E039 Hypothyroidism, unspecified: Secondary | ICD-10-CM | POA: Diagnosis not present

## 2022-10-30 DIAGNOSIS — M8580 Other specified disorders of bone density and structure, unspecified site: Secondary | ICD-10-CM | POA: Diagnosis not present

## 2022-10-30 DIAGNOSIS — R69 Illness, unspecified: Secondary | ICD-10-CM | POA: Diagnosis not present

## 2022-10-30 DIAGNOSIS — R7303 Prediabetes: Secondary | ICD-10-CM | POA: Diagnosis not present

## 2022-10-30 DIAGNOSIS — B009 Herpesviral infection, unspecified: Secondary | ICD-10-CM | POA: Diagnosis not present

## 2022-10-30 DIAGNOSIS — J4530 Mild persistent asthma, uncomplicated: Secondary | ICD-10-CM | POA: Diagnosis not present

## 2022-10-30 DIAGNOSIS — J309 Allergic rhinitis, unspecified: Secondary | ICD-10-CM | POA: Diagnosis not present

## 2022-10-30 DIAGNOSIS — E785 Hyperlipidemia, unspecified: Secondary | ICD-10-CM | POA: Diagnosis not present

## 2022-10-30 DIAGNOSIS — J45909 Unspecified asthma, uncomplicated: Secondary | ICD-10-CM | POA: Diagnosis not present

## 2022-11-08 DIAGNOSIS — M25512 Pain in left shoulder: Secondary | ICD-10-CM | POA: Diagnosis not present

## 2022-11-20 DIAGNOSIS — R69 Illness, unspecified: Secondary | ICD-10-CM | POA: Diagnosis not present

## 2022-12-06 DIAGNOSIS — M25512 Pain in left shoulder: Secondary | ICD-10-CM | POA: Diagnosis not present

## 2022-12-27 DIAGNOSIS — M25512 Pain in left shoulder: Secondary | ICD-10-CM | POA: Diagnosis not present

## 2023-01-29 DIAGNOSIS — H40013 Open angle with borderline findings, low risk, bilateral: Secondary | ICD-10-CM | POA: Diagnosis not present

## 2023-01-31 DIAGNOSIS — M25512 Pain in left shoulder: Secondary | ICD-10-CM | POA: Diagnosis not present

## 2023-03-04 DIAGNOSIS — M5441 Lumbago with sciatica, right side: Secondary | ICD-10-CM | POA: Diagnosis not present

## 2023-03-08 DIAGNOSIS — M7918 Myalgia, other site: Secondary | ICD-10-CM | POA: Diagnosis not present

## 2023-03-20 DIAGNOSIS — M545 Low back pain, unspecified: Secondary | ICD-10-CM | POA: Diagnosis not present

## 2023-04-04 DIAGNOSIS — M25512 Pain in left shoulder: Secondary | ICD-10-CM | POA: Diagnosis not present

## 2023-04-04 DIAGNOSIS — M545 Low back pain, unspecified: Secondary | ICD-10-CM | POA: Diagnosis not present

## 2023-04-05 DIAGNOSIS — M25512 Pain in left shoulder: Secondary | ICD-10-CM | POA: Diagnosis not present

## 2023-04-05 DIAGNOSIS — M545 Low back pain, unspecified: Secondary | ICD-10-CM | POA: Diagnosis not present

## 2023-04-10 DIAGNOSIS — M545 Low back pain, unspecified: Secondary | ICD-10-CM | POA: Diagnosis not present

## 2023-04-10 DIAGNOSIS — M25512 Pain in left shoulder: Secondary | ICD-10-CM | POA: Diagnosis not present

## 2023-04-11 DIAGNOSIS — M25512 Pain in left shoulder: Secondary | ICD-10-CM | POA: Diagnosis not present

## 2023-04-11 DIAGNOSIS — M545 Low back pain, unspecified: Secondary | ICD-10-CM | POA: Diagnosis not present

## 2023-04-16 DIAGNOSIS — M25512 Pain in left shoulder: Secondary | ICD-10-CM | POA: Diagnosis not present

## 2023-04-16 DIAGNOSIS — M545 Low back pain, unspecified: Secondary | ICD-10-CM | POA: Diagnosis not present

## 2023-04-18 DIAGNOSIS — Z6829 Body mass index (BMI) 29.0-29.9, adult: Secondary | ICD-10-CM | POA: Diagnosis not present

## 2023-04-18 DIAGNOSIS — Z1231 Encounter for screening mammogram for malignant neoplasm of breast: Secondary | ICD-10-CM | POA: Diagnosis not present

## 2023-04-18 DIAGNOSIS — Z01419 Encounter for gynecological examination (general) (routine) without abnormal findings: Secondary | ICD-10-CM | POA: Diagnosis not present

## 2023-04-23 DIAGNOSIS — M5416 Radiculopathy, lumbar region: Secondary | ICD-10-CM | POA: Diagnosis not present

## 2023-04-23 DIAGNOSIS — M25571 Pain in right ankle and joints of right foot: Secondary | ICD-10-CM | POA: Diagnosis not present

## 2023-04-25 DIAGNOSIS — E785 Hyperlipidemia, unspecified: Secondary | ICD-10-CM | POA: Diagnosis not present

## 2023-04-25 DIAGNOSIS — E039 Hypothyroidism, unspecified: Secondary | ICD-10-CM | POA: Diagnosis not present

## 2023-04-25 DIAGNOSIS — R7303 Prediabetes: Secondary | ICD-10-CM | POA: Diagnosis not present

## 2023-04-26 DIAGNOSIS — M25512 Pain in left shoulder: Secondary | ICD-10-CM | POA: Diagnosis not present

## 2023-04-26 DIAGNOSIS — M545 Low back pain, unspecified: Secondary | ICD-10-CM | POA: Diagnosis not present

## 2023-04-27 DIAGNOSIS — M545 Low back pain, unspecified: Secondary | ICD-10-CM | POA: Diagnosis not present

## 2023-04-30 DIAGNOSIS — Z79899 Other long term (current) drug therapy: Secondary | ICD-10-CM | POA: Diagnosis not present

## 2023-04-30 DIAGNOSIS — F419 Anxiety disorder, unspecified: Secondary | ICD-10-CM | POA: Diagnosis not present

## 2023-04-30 DIAGNOSIS — R7303 Prediabetes: Secondary | ICD-10-CM | POA: Diagnosis not present

## 2023-04-30 DIAGNOSIS — J309 Allergic rhinitis, unspecified: Secondary | ICD-10-CM | POA: Diagnosis not present

## 2023-04-30 DIAGNOSIS — F3342 Major depressive disorder, recurrent, in full remission: Secondary | ICD-10-CM | POA: Diagnosis not present

## 2023-04-30 DIAGNOSIS — Z6829 Body mass index (BMI) 29.0-29.9, adult: Secondary | ICD-10-CM | POA: Diagnosis not present

## 2023-04-30 DIAGNOSIS — R079 Chest pain, unspecified: Secondary | ICD-10-CM | POA: Diagnosis not present

## 2023-04-30 DIAGNOSIS — M8580 Other specified disorders of bone density and structure, unspecified site: Secondary | ICD-10-CM | POA: Diagnosis not present

## 2023-04-30 DIAGNOSIS — E785 Hyperlipidemia, unspecified: Secondary | ICD-10-CM | POA: Diagnosis not present

## 2023-04-30 DIAGNOSIS — M5431 Sciatica, right side: Secondary | ICD-10-CM | POA: Diagnosis not present

## 2023-04-30 DIAGNOSIS — E039 Hypothyroidism, unspecified: Secondary | ICD-10-CM | POA: Diagnosis not present

## 2023-04-30 DIAGNOSIS — M25512 Pain in left shoulder: Secondary | ICD-10-CM | POA: Diagnosis not present

## 2023-04-30 DIAGNOSIS — J4530 Mild persistent asthma, uncomplicated: Secondary | ICD-10-CM | POA: Diagnosis not present

## 2023-04-30 DIAGNOSIS — M545 Low back pain, unspecified: Secondary | ICD-10-CM | POA: Diagnosis not present

## 2023-05-01 DIAGNOSIS — M25512 Pain in left shoulder: Secondary | ICD-10-CM | POA: Diagnosis not present

## 2023-05-01 DIAGNOSIS — M545 Low back pain, unspecified: Secondary | ICD-10-CM | POA: Diagnosis not present

## 2023-05-07 DIAGNOSIS — E663 Overweight: Secondary | ICD-10-CM | POA: Diagnosis not present

## 2023-05-07 DIAGNOSIS — Z0000 Encounter for general adult medical examination without abnormal findings: Secondary | ICD-10-CM | POA: Diagnosis not present

## 2023-05-09 DIAGNOSIS — M545 Low back pain, unspecified: Secondary | ICD-10-CM | POA: Diagnosis not present

## 2023-05-09 DIAGNOSIS — M25512 Pain in left shoulder: Secondary | ICD-10-CM | POA: Diagnosis not present

## 2023-05-11 DIAGNOSIS — M5416 Radiculopathy, lumbar region: Secondary | ICD-10-CM | POA: Diagnosis not present

## 2023-05-14 DIAGNOSIS — M25512 Pain in left shoulder: Secondary | ICD-10-CM | POA: Diagnosis not present

## 2023-05-14 DIAGNOSIS — M25571 Pain in right ankle and joints of right foot: Secondary | ICD-10-CM | POA: Diagnosis not present

## 2023-05-14 DIAGNOSIS — M545 Low back pain, unspecified: Secondary | ICD-10-CM | POA: Diagnosis not present

## 2023-05-21 DIAGNOSIS — M545 Low back pain, unspecified: Secondary | ICD-10-CM | POA: Diagnosis not present

## 2023-05-21 DIAGNOSIS — M25512 Pain in left shoulder: Secondary | ICD-10-CM | POA: Diagnosis not present

## 2023-05-30 DIAGNOSIS — M545 Low back pain, unspecified: Secondary | ICD-10-CM | POA: Diagnosis not present

## 2023-05-30 DIAGNOSIS — M25512 Pain in left shoulder: Secondary | ICD-10-CM | POA: Diagnosis not present

## 2023-06-05 DIAGNOSIS — M25512 Pain in left shoulder: Secondary | ICD-10-CM | POA: Diagnosis not present

## 2023-06-05 DIAGNOSIS — M545 Low back pain, unspecified: Secondary | ICD-10-CM | POA: Diagnosis not present

## 2023-06-18 DIAGNOSIS — M25512 Pain in left shoulder: Secondary | ICD-10-CM | POA: Diagnosis not present

## 2023-06-18 DIAGNOSIS — M545 Low back pain, unspecified: Secondary | ICD-10-CM | POA: Diagnosis not present

## 2023-07-02 DIAGNOSIS — M25512 Pain in left shoulder: Secondary | ICD-10-CM | POA: Diagnosis not present

## 2023-07-02 DIAGNOSIS — M545 Low back pain, unspecified: Secondary | ICD-10-CM | POA: Diagnosis not present

## 2023-07-11 DIAGNOSIS — M25512 Pain in left shoulder: Secondary | ICD-10-CM | POA: Diagnosis not present

## 2023-07-11 DIAGNOSIS — M545 Low back pain, unspecified: Secondary | ICD-10-CM | POA: Diagnosis not present

## 2023-07-30 DIAGNOSIS — M25512 Pain in left shoulder: Secondary | ICD-10-CM | POA: Diagnosis not present

## 2023-07-30 DIAGNOSIS — M545 Low back pain, unspecified: Secondary | ICD-10-CM | POA: Diagnosis not present

## 2023-08-02 DIAGNOSIS — D225 Melanocytic nevi of trunk: Secondary | ICD-10-CM | POA: Diagnosis not present

## 2023-08-02 DIAGNOSIS — X32XXXD Exposure to sunlight, subsequent encounter: Secondary | ICD-10-CM | POA: Diagnosis not present

## 2023-08-02 DIAGNOSIS — L570 Actinic keratosis: Secondary | ICD-10-CM | POA: Diagnosis not present

## 2023-08-02 DIAGNOSIS — Z1283 Encounter for screening for malignant neoplasm of skin: Secondary | ICD-10-CM | POA: Diagnosis not present

## 2023-08-02 DIAGNOSIS — C44519 Basal cell carcinoma of skin of other part of trunk: Secondary | ICD-10-CM | POA: Diagnosis not present

## 2023-08-06 DIAGNOSIS — M25512 Pain in left shoulder: Secondary | ICD-10-CM | POA: Diagnosis not present

## 2023-08-06 DIAGNOSIS — M545 Low back pain, unspecified: Secondary | ICD-10-CM | POA: Diagnosis not present

## 2023-08-15 DIAGNOSIS — M25512 Pain in left shoulder: Secondary | ICD-10-CM | POA: Diagnosis not present

## 2023-08-15 DIAGNOSIS — M545 Low back pain, unspecified: Secondary | ICD-10-CM | POA: Diagnosis not present

## 2023-08-27 DIAGNOSIS — M545 Low back pain, unspecified: Secondary | ICD-10-CM | POA: Diagnosis not present

## 2023-08-27 DIAGNOSIS — M25512 Pain in left shoulder: Secondary | ICD-10-CM | POA: Diagnosis not present

## 2023-09-03 DIAGNOSIS — M545 Low back pain, unspecified: Secondary | ICD-10-CM | POA: Diagnosis not present

## 2023-09-03 DIAGNOSIS — M25512 Pain in left shoulder: Secondary | ICD-10-CM | POA: Diagnosis not present

## 2023-10-05 DIAGNOSIS — J45901 Unspecified asthma with (acute) exacerbation: Secondary | ICD-10-CM | POA: Diagnosis not present

## 2023-10-05 DIAGNOSIS — Z6830 Body mass index (BMI) 30.0-30.9, adult: Secondary | ICD-10-CM | POA: Diagnosis not present

## 2023-11-08 ENCOUNTER — Other Ambulatory Visit (HOSPITAL_BASED_OUTPATIENT_CLINIC_OR_DEPARTMENT_OTHER): Payer: Self-pay | Admitting: Family Medicine

## 2023-11-08 DIAGNOSIS — E785 Hyperlipidemia, unspecified: Secondary | ICD-10-CM

## 2023-11-23 ENCOUNTER — Other Ambulatory Visit (HOSPITAL_COMMUNITY): Payer: Self-pay

## 2023-12-03 DIAGNOSIS — J45901 Unspecified asthma with (acute) exacerbation: Secondary | ICD-10-CM | POA: Diagnosis not present

## 2023-12-03 DIAGNOSIS — J4530 Mild persistent asthma, uncomplicated: Secondary | ICD-10-CM | POA: Diagnosis not present

## 2023-12-03 DIAGNOSIS — Z6830 Body mass index (BMI) 30.0-30.9, adult: Secondary | ICD-10-CM | POA: Diagnosis not present

## 2023-12-05 ENCOUNTER — Ambulatory Visit (HOSPITAL_COMMUNITY)
Admission: RE | Admit: 2023-12-05 | Discharge: 2023-12-05 | Disposition: A | Discharge status: Home / Self Care | Payer: Self-pay | Source: Ambulatory Visit | Attending: Family Medicine | Admitting: Family Medicine

## 2023-12-05 DIAGNOSIS — E785 Hyperlipidemia, unspecified: Secondary | ICD-10-CM | POA: Insufficient documentation

## 2023-12-24 DIAGNOSIS — M25561 Pain in right knee: Secondary | ICD-10-CM | POA: Diagnosis not present

## 2024-01-02 DIAGNOSIS — M25561 Pain in right knee: Secondary | ICD-10-CM | POA: Diagnosis not present

## 2024-01-21 DIAGNOSIS — M25561 Pain in right knee: Secondary | ICD-10-CM | POA: Diagnosis not present

## 2024-01-28 DIAGNOSIS — M25561 Pain in right knee: Secondary | ICD-10-CM | POA: Diagnosis not present

## 2024-02-04 DIAGNOSIS — M25561 Pain in right knee: Secondary | ICD-10-CM | POA: Diagnosis not present

## 2024-02-18 DIAGNOSIS — M25561 Pain in right knee: Secondary | ICD-10-CM | POA: Diagnosis not present

## 2024-02-25 DIAGNOSIS — M545 Low back pain, unspecified: Secondary | ICD-10-CM | POA: Diagnosis not present

## 2024-02-25 DIAGNOSIS — M25561 Pain in right knee: Secondary | ICD-10-CM | POA: Diagnosis not present

## 2024-02-28 DIAGNOSIS — S63591A Other specified sprain of right wrist, initial encounter: Secondary | ICD-10-CM | POA: Diagnosis not present

## 2024-02-28 DIAGNOSIS — Q740 Other congenital malformations of upper limb(s), including shoulder girdle: Secondary | ICD-10-CM | POA: Diagnosis not present

## 2024-03-28 DIAGNOSIS — M25561 Pain in right knee: Secondary | ICD-10-CM | POA: Diagnosis not present

## 2024-03-28 DIAGNOSIS — M545 Low back pain, unspecified: Secondary | ICD-10-CM | POA: Diagnosis not present

## 2024-04-07 DIAGNOSIS — M25561 Pain in right knee: Secondary | ICD-10-CM | POA: Diagnosis not present

## 2024-04-07 DIAGNOSIS — M545 Low back pain, unspecified: Secondary | ICD-10-CM | POA: Diagnosis not present

## 2024-04-21 DIAGNOSIS — Z6831 Body mass index (BMI) 31.0-31.9, adult: Secondary | ICD-10-CM | POA: Diagnosis not present

## 2024-04-21 DIAGNOSIS — M545 Low back pain, unspecified: Secondary | ICD-10-CM | POA: Diagnosis not present

## 2024-04-21 DIAGNOSIS — M25561 Pain in right knee: Secondary | ICD-10-CM | POA: Diagnosis not present

## 2024-04-21 DIAGNOSIS — Z01419 Encounter for gynecological examination (general) (routine) without abnormal findings: Secondary | ICD-10-CM | POA: Diagnosis not present

## 2024-05-05 DIAGNOSIS — M545 Low back pain, unspecified: Secondary | ICD-10-CM | POA: Diagnosis not present

## 2024-05-05 DIAGNOSIS — M25561 Pain in right knee: Secondary | ICD-10-CM | POA: Diagnosis not present

## 2024-05-12 DIAGNOSIS — E039 Hypothyroidism, unspecified: Secondary | ICD-10-CM | POA: Diagnosis not present

## 2024-05-12 DIAGNOSIS — R7303 Prediabetes: Secondary | ICD-10-CM | POA: Diagnosis not present

## 2024-05-19 DIAGNOSIS — Z6829 Body mass index (BMI) 29.0-29.9, adult: Secondary | ICD-10-CM | POA: Diagnosis not present

## 2024-05-19 DIAGNOSIS — E785 Hyperlipidemia, unspecified: Secondary | ICD-10-CM | POA: Diagnosis not present

## 2024-05-19 DIAGNOSIS — F3342 Major depressive disorder, recurrent, in full remission: Secondary | ICD-10-CM | POA: Diagnosis not present

## 2024-05-19 DIAGNOSIS — J4530 Mild persistent asthma, uncomplicated: Secondary | ICD-10-CM | POA: Diagnosis not present

## 2024-05-19 DIAGNOSIS — B009 Herpesviral infection, unspecified: Secondary | ICD-10-CM | POA: Diagnosis not present

## 2024-05-19 DIAGNOSIS — R7303 Prediabetes: Secondary | ICD-10-CM | POA: Diagnosis not present

## 2024-05-19 DIAGNOSIS — M8580 Other specified disorders of bone density and structure, unspecified site: Secondary | ICD-10-CM | POA: Diagnosis not present

## 2024-05-19 DIAGNOSIS — F419 Anxiety disorder, unspecified: Secondary | ICD-10-CM | POA: Diagnosis not present

## 2024-05-19 DIAGNOSIS — D692 Other nonthrombocytopenic purpura: Secondary | ICD-10-CM | POA: Diagnosis not present

## 2024-05-19 DIAGNOSIS — E039 Hypothyroidism, unspecified: Secondary | ICD-10-CM | POA: Diagnosis not present

## 2024-05-19 DIAGNOSIS — J309 Allergic rhinitis, unspecified: Secondary | ICD-10-CM | POA: Diagnosis not present

## 2024-05-26 DIAGNOSIS — M545 Low back pain, unspecified: Secondary | ICD-10-CM | POA: Diagnosis not present

## 2024-05-26 DIAGNOSIS — M25561 Pain in right knee: Secondary | ICD-10-CM | POA: Diagnosis not present

## 2024-06-04 DIAGNOSIS — Z0000 Encounter for general adult medical examination without abnormal findings: Secondary | ICD-10-CM | POA: Diagnosis not present

## 2024-06-04 DIAGNOSIS — Z23 Encounter for immunization: Secondary | ICD-10-CM | POA: Diagnosis not present

## 2024-06-04 DIAGNOSIS — Z1331 Encounter for screening for depression: Secondary | ICD-10-CM | POA: Diagnosis not present

## 2024-06-19 DIAGNOSIS — H40013 Open angle with borderline findings, low risk, bilateral: Secondary | ICD-10-CM | POA: Diagnosis not present

## 2024-06-25 DIAGNOSIS — M545 Low back pain, unspecified: Secondary | ICD-10-CM | POA: Diagnosis not present

## 2024-06-25 DIAGNOSIS — M25561 Pain in right knee: Secondary | ICD-10-CM | POA: Diagnosis not present

## 2024-07-09 DIAGNOSIS — M25561 Pain in right knee: Secondary | ICD-10-CM | POA: Diagnosis not present

## 2024-07-09 DIAGNOSIS — M545 Low back pain, unspecified: Secondary | ICD-10-CM | POA: Diagnosis not present

## 2024-07-25 DIAGNOSIS — M25561 Pain in right knee: Secondary | ICD-10-CM | POA: Diagnosis not present

## 2024-07-25 DIAGNOSIS — M545 Low back pain, unspecified: Secondary | ICD-10-CM | POA: Diagnosis not present

## 2024-08-07 DIAGNOSIS — M545 Low back pain, unspecified: Secondary | ICD-10-CM | POA: Diagnosis not present

## 2024-08-07 DIAGNOSIS — M25561 Pain in right knee: Secondary | ICD-10-CM | POA: Diagnosis not present

## 2024-08-07 DIAGNOSIS — Z1231 Encounter for screening mammogram for malignant neoplasm of breast: Secondary | ICD-10-CM | POA: Diagnosis not present

## 2024-08-18 DIAGNOSIS — Z85828 Personal history of other malignant neoplasm of skin: Secondary | ICD-10-CM | POA: Diagnosis not present

## 2024-08-18 DIAGNOSIS — C44311 Basal cell carcinoma of skin of nose: Secondary | ICD-10-CM | POA: Diagnosis not present

## 2024-08-18 DIAGNOSIS — Z08 Encounter for follow-up examination after completed treatment for malignant neoplasm: Secondary | ICD-10-CM | POA: Diagnosis not present

## 2024-08-18 DIAGNOSIS — D225 Melanocytic nevi of trunk: Secondary | ICD-10-CM | POA: Diagnosis not present

## 2024-08-18 DIAGNOSIS — Z1283 Encounter for screening for malignant neoplasm of skin: Secondary | ICD-10-CM | POA: Diagnosis not present

## 2024-09-03 DIAGNOSIS — M545 Low back pain, unspecified: Secondary | ICD-10-CM | POA: Diagnosis not present

## 2024-09-03 DIAGNOSIS — M25561 Pain in right knee: Secondary | ICD-10-CM | POA: Diagnosis not present

## 2024-10-03 ENCOUNTER — Encounter (INDEPENDENT_AMBULATORY_CARE_PROVIDER_SITE_OTHER): Payer: Self-pay
